# Patient Record
Sex: Female | Born: 1970 | Race: Black or African American | Hispanic: No | Marital: Married | Smoking: Never smoker
Health system: Southern US, Community
[De-identification: ages and names within clinical notes are randomized; demographics above are authoritative.]

## PROBLEM LIST (undated history)

## (undated) DIAGNOSIS — IMO0002 Reserved for concepts with insufficient information to code with codable children: Secondary | ICD-10-CM

## (undated) DIAGNOSIS — Z8619 Personal history of other infectious and parasitic diseases: Secondary | ICD-10-CM

## (undated) DIAGNOSIS — A749 Chlamydial infection, unspecified: Secondary | ICD-10-CM

## (undated) DIAGNOSIS — M1990 Unspecified osteoarthritis, unspecified site: Secondary | ICD-10-CM

## (undated) DIAGNOSIS — F419 Anxiety disorder, unspecified: Secondary | ICD-10-CM

## (undated) HISTORY — PX: WISDOM TOOTH EXTRACTION: SHX21

## (undated) HISTORY — DX: Chlamydial infection, unspecified: A74.9

## (undated) HISTORY — PX: TUBAL LIGATION: SHX77

## (undated) HISTORY — DX: Personal history of other infectious and parasitic diseases: Z86.19

## (undated) HISTORY — DX: Reserved for concepts with insufficient information to code with codable children: IMO0002

---
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
## 1994-02-01 DIAGNOSIS — R87619 Unspecified abnormal cytological findings in specimens from cervix uteri: Secondary | ICD-10-CM

## 1994-02-01 DIAGNOSIS — IMO0002 Reserved for concepts with insufficient information to code with codable children: Secondary | ICD-10-CM

## 1994-02-01 HISTORY — DX: Reserved for concepts with insufficient information to code with codable children: IMO0002

## 1994-02-01 HISTORY — DX: Unspecified abnormal cytological findings in specimens from cervix uteri: R87.619

## 1997-07-18 ENCOUNTER — Encounter: Admission: RE | Admit: 1997-07-18 | Discharge: 1997-07-18 | Payer: Self-pay | Admitting: Obstetrics

## 1997-11-07 ENCOUNTER — Other Ambulatory Visit: Admission: RE | Admit: 1997-11-07 | Discharge: 1997-11-07 | Payer: Self-pay | Admitting: *Deleted

## 1998-05-12 ENCOUNTER — Ambulatory Visit (HOSPITAL_COMMUNITY): Admission: RE | Admit: 1998-05-12 | Discharge: 1998-05-12 | Payer: Self-pay | Admitting: Obstetrics and Gynecology

## 1999-09-13 ENCOUNTER — Emergency Department (HOSPITAL_COMMUNITY): Admission: EM | Admit: 1999-09-13 | Discharge: 1999-09-13 | Payer: Self-pay | Admitting: Emergency Medicine

## 1999-09-15 ENCOUNTER — Other Ambulatory Visit: Admission: RE | Admit: 1999-09-15 | Discharge: 1999-09-15 | Payer: Self-pay | Admitting: Obstetrics and Gynecology

## 2000-03-13 ENCOUNTER — Emergency Department (HOSPITAL_COMMUNITY): Admission: EM | Admit: 2000-03-13 | Discharge: 2000-03-13 | Payer: Self-pay | Admitting: *Deleted

## 2000-03-22 ENCOUNTER — Encounter: Admission: RE | Admit: 2000-03-22 | Discharge: 2000-04-12 | Payer: Self-pay | Admitting: Family Medicine

## 2000-11-14 ENCOUNTER — Emergency Department (HOSPITAL_COMMUNITY): Admission: EM | Admit: 2000-11-14 | Discharge: 2000-11-14 | Payer: Self-pay | Admitting: Emergency Medicine

## 2001-12-20 ENCOUNTER — Encounter: Admission: RE | Admit: 2001-12-20 | Discharge: 2001-12-20 | Payer: Self-pay | Admitting: Internal Medicine

## 2003-05-05 ENCOUNTER — Emergency Department (HOSPITAL_COMMUNITY): Admission: EM | Admit: 2003-05-05 | Discharge: 2003-05-05 | Payer: Self-pay | Admitting: Emergency Medicine

## 2005-04-13 ENCOUNTER — Encounter: Admission: RE | Admit: 2005-04-13 | Discharge: 2005-04-13 | Payer: Self-pay | Admitting: Internal Medicine

## 2008-01-22 ENCOUNTER — Emergency Department (HOSPITAL_BASED_OUTPATIENT_CLINIC_OR_DEPARTMENT_OTHER): Admission: EM | Admit: 2008-01-22 | Discharge: 2008-01-22 | Payer: Self-pay | Admitting: Emergency Medicine

## 2009-03-01 ENCOUNTER — Ambulatory Visit: Payer: Self-pay | Admitting: Radiology

## 2009-03-01 ENCOUNTER — Emergency Department (HOSPITAL_BASED_OUTPATIENT_CLINIC_OR_DEPARTMENT_OTHER): Admission: EM | Admit: 2009-03-01 | Discharge: 2009-03-01 | Payer: Self-pay | Admitting: Emergency Medicine

## 2009-10-15 ENCOUNTER — Emergency Department (HOSPITAL_BASED_OUTPATIENT_CLINIC_OR_DEPARTMENT_OTHER): Admission: EM | Admit: 2009-10-15 | Discharge: 2009-10-15 | Payer: Self-pay | Admitting: Emergency Medicine

## 2010-08-23 ENCOUNTER — Encounter: Payer: Self-pay | Admitting: *Deleted

## 2010-08-23 ENCOUNTER — Emergency Department (HOSPITAL_BASED_OUTPATIENT_CLINIC_OR_DEPARTMENT_OTHER)
Admission: EM | Admit: 2010-08-23 | Discharge: 2010-08-23 | Disposition: A | Discharge status: Home / Self Care | Payer: BC Managed Care – PPO | Attending: Emergency Medicine | Admitting: Emergency Medicine

## 2010-08-23 DIAGNOSIS — IMO0002 Reserved for concepts with insufficient information to code with codable children: Secondary | ICD-10-CM | POA: Insufficient documentation

## 2010-08-23 DIAGNOSIS — M25519 Pain in unspecified shoulder: Secondary | ICD-10-CM | POA: Insufficient documentation

## 2010-08-23 DIAGNOSIS — X58XXXA Exposure to other specified factors, initial encounter: Secondary | ICD-10-CM | POA: Insufficient documentation

## 2010-08-23 MED ORDER — DIAZEPAM 5 MG PO TABS: 5.0000 mg | ORAL_TABLET | Freq: Two times a day (BID) | ORAL | Status: AC

## 2010-08-23 MED ORDER — IBUPROFEN 800 MG PO TABS: 800.0000 mg | ORAL_TABLET | Freq: Three times a day (TID) | ORAL | Status: AC

## 2010-08-23 NOTE — ED Notes (Signed)
Pt states she has had an achy feeling on the right side of her neck X 1 week. No known injury.

## 2010-08-23 NOTE — ED Provider Notes (Signed)
History     Chief Complaint  Patient presents with  . Neck Pain   HPI Pt has had intermittent pain in right anterior neck/shoulder x 1wk.  Aggravated by raising RUE above head.  Some relief w/ ibuprofen.  No associated upper extremity paresthesias.  Has not had cough, fever or SOB.  Denies trauma.    History reviewed. No pertinent past medical history.  Past Surgical History  Procedure Date  . Knee surgery   . Tubal ligation     History reviewed. No pertinent family history.  History  Substance Use Topics  . Smoking status: Never Smoker   . Smokeless tobacco: Not on file  . Alcohol Use: Yes    OB History    Grav Para Term Preterm Abortions TAB SAB Ect Mult Living                  Review of Systems  All other systems reviewed and are negative.    Physical Exam  BP 145/80  Pulse 72  Temp(Src) 99.5 F (37.5 C) (Oral)  Resp 20  Ht 5\' 7"  (1.702 m)  Wt 205 lb (92.987 kg)  BMI 32.11 kg/m2  SpO2 100%  LMP 08/09/2010  Physical Exam  Nursing note and vitals reviewed. Constitutional: She is oriented to person, place, and time. She appears well-developed and well-nourished. No distress.  HENT:  Head: Normocephalic and atraumatic.  Eyes:       Normal appearance  Neck: Normal range of motion. Neck supple.       Non-tender.  No carotid bruit  Cardiovascular: Normal rate and regular rhythm.   Pulmonary/Chest: Effort normal and breath sounds normal. No stridor.  Musculoskeletal:       No c-spine ttp. Tenderness of right trap b/w base of neck and humeral head.  No bony tenderness.  Pain aggravated by abduction >90 deg on exam.  NV of RUE intact.    Neurological: She is alert and oriented to person, place, and time.  Skin: Skin is warm and dry. No rash noted.  Psychiatric: She has a normal mood and affect. Her behavior is normal.    ED Course  Procedures  MDM Pt presents w/ non-traumatic right shoulder pain.  No bony tenderness on exam and RUE w/ full ROM and  intact NV.  S/Sx most consistent w/ muscle strain.  Pt discharged home w/ ibuprofen and valium for pain and I recommended rest/heat as well.  She has a PCP to f/u with.        Otilio Miu, [place] 08/24/10 [phone]

## 2011-08-26 ENCOUNTER — Other Ambulatory Visit: Payer: Self-pay | Admitting: Obstetrics and Gynecology

## 2011-08-26 ENCOUNTER — Encounter: Payer: Self-pay | Admitting: Obstetrics and Gynecology

## 2011-08-26 ENCOUNTER — Ambulatory Visit (INDEPENDENT_AMBULATORY_CARE_PROVIDER_SITE_OTHER): Payer: BC Managed Care – PPO | Admitting: Obstetrics and Gynecology

## 2011-08-26 VITALS — BP 144/82 | HR 84 | Ht 66.0 in | Wt 204.0 lb

## 2011-08-26 DIAGNOSIS — N852 Hypertrophy of uterus: Secondary | ICD-10-CM

## 2011-08-26 DIAGNOSIS — R350 Frequency of micturition: Secondary | ICD-10-CM

## 2011-08-26 DIAGNOSIS — Z01419 Encounter for gynecological examination (general) (routine) without abnormal findings: Secondary | ICD-10-CM

## 2011-08-26 DIAGNOSIS — Z124 Encounter for screening for malignant neoplasm of cervix: Secondary | ICD-10-CM

## 2011-08-26 NOTE — Progress Notes (Signed)
Regular Periods: yes Mammogram: yes  Monthly Breast Ex.: no Exercise: no  Tetanus < 10 years: yes Seatbelts: yes  NI. Bladder Functn.: yes Abuse at home: no  Daily BM's: no Stressful Work: no  Healthy Diet: no Sigmoid-Colonoscopy: NO  Calcium: no Medical problems this year: ? FIBROIDS   LAST PAP:2011  Contraception: BTL  Mammogram:  AGE 41  PCP: NO  PMH: NO CHANGE  FMH: NO CHANGE  Last Bone Scan: NO

## 2011-08-26 NOTE — Progress Notes (Signed)
Subjective:    Joan Riley is a 41 y.o. female, G2P2, who presents for an annual exam. The patient reports "abnormal growth in stomach"  noted about a month ago and went to the Urgent Care and was told that uterus was enlarged.  On occasion will ache but no true pain.  Has also noticed that it had become uncomfortable to wear a waistband.  Menstrual cycle:   LMP: Patient's last menstrual period was 08/05/2011. Flow 6 days, change pads every 3 hours; cramps 8/10 lessened to 5/10 with Midol           Review of Systems Pertinent items are noted in HPI. Denies pelvic pain but admits to an "ache"; denies vaginitis symptoms, irregular bleeding, menopausal symptoms, change in bowel habits or rectal bleeding   Objective:    BP 144/82  Pulse 84  Ht 5\' 6"  (1.676 m)  Wt 204 lb (92.534 kg)  BMI 32.93 kg/m2  LMP 08/05/2011    Wt Readings from Last 1 Encounters:  08/26/11 204 lb (92.534 kg)   Body mass index is 32.93 kg/(m^2). General Appearance: Alert, no acute distress HEENT: Grossly normal Neck / Thyroid: Supple, no thyromegaly or cervical adenopathy Lungs: Clear to auscultation bilaterally Back: No CVA tenderness Breast Exam: No masses or nodes.No dimpling, nipple retraction or discharge. Cardiovascular: Regular rate and rhythm.  Gastrointestinal: Soft, non-tender, no masses or organomegaly Pelvic Exam: EGBUS-wnl, vagina-normal rugae, small blood, cervix- without lesions or tenderness, uterus appears 20-22 weeks size with right fundus 24 weeks, tender and irregular, adnexae-no masses or tenderness Rectovaginal: no masses and normal sphincter tone Lymphatic Exam: Non-palpable nodes in neck, clavicular,  axillary, or inguinal regions  Skin: no rashes or abnormalities Extremities: no clubbing cyanosis or edema  Neurologic: grossly normal Psychiatric: Alert and oriented  Urinalysis: pH-5.0,  SG-1.020, blood 2+   Assessment:   Routine GYN Exam Urinary Frequency Uterine  Enlargement-Fibroids Plan:  Urine for culture   Pelvic U/S for uterine enlargement  PAP sent  Reviewed fibroids along with medical and surgical management options-patient want hysterectomy  Reviewed all types of hysterectomies with focus on abdominal hysterectomy given uterine size  RTO for U/S and follow up with physician of choice  Ezechiel Stooksbury,ELMIRAPA-C

## 2011-08-26 NOTE — Patient Instructions (Signed)
Fibroids You have been diagnosed as having a fibroid. Fibroids are smooth muscle lumps (tumors) which can occur any place in a woman's body. They are usually in the womb (uterus). The most common problem (symptom) of fibroids is bleeding. Over time this may cause low red blood cells (anemia). Other symptoms include feelings of pressure and pain in the pelvis. The diagnosis (learning what is wrong) of fibroids is made by physical exam. Sometimes tests such as an ultrasound are used. This is helpful when fibroids are felt around the ovaries and to look for tumors. TREATMENT   Most fibroids do not need surgical or medical treatment. Sometimes a tissue sample (biopsy) of the lining of the uterus is done to rule out cancer. If there is no cancer and only a small amount of bleeding, the problem can be watched.   Hormonal treatment can improve the problem.   When surgery is needed, it can consist of removing the fibroid. Vaginal birth may not be possible after the removal of fibroids. This depends on where they are and the extent of surgery. When pregnancy occurs with fibroids it is usually normal.   Your caregiver can help decide which treatments are best for you.  HOME CARE INSTRUCTIONS   Do not use aspirin as this may increase bleeding problems.   If your periods (menses) are heavy, record the number of pads or tampons used per month. Bring this information to your caregiver. This can help them determine the best treatment for you.  SEEK IMMEDIATE MEDICAL CARE IF:  You have pelvic pain or cramps not controlled with medications, or experience a sudden increase in pain.   You have an increase of pelvic bleeding between and during menses.   You feel lightheaded or have fainting spells.   You develop worsening belly (abdominal) pain.  Document Released: 01/16/2000 Document Revised: 01/07/2011 Document Reviewed: 09/07/2007 ExitCare Patient Information 2012 [employer].  Hysterectomy  Information  A hysterectomy is a procedure where your uterus is surgically removed. It will no longer be possible to have menstrual periods or to become pregnant. The tubes and ovaries can be removed (bilateral salpingo-oopherectomy) during this surgery as well.  REASONS FOR A HYSTERECTOMY  Persistent, abnormal bleeding.   Lasting (chronic) pelvic pain or infection.   The lining of the uterus (endometrium) starts growing outside the uterus (endometriosis).   The endometrium starts growing in the muscle of the uterus (adenomyosis).   The uterus falls down into the vagina (pelvic organ prolapse).   Symptomatic uterine fibroids.   Precancerous cells.   Cervical cancer or uterine cancer.  TYPES OF HYSTERECTOMIES  Supracervical hysterectomy. This type removes the top part of the uterus, but not the cervix.   Total hysterectomy. This type removes the uterus and cervix.   Radical hysterectomy. This type removes the uterus, cervix, and the fibrous tissue that holds the uterus in place in the pelvis (parametrium).  WAYS A HYSTERECTOMY CAN BE PERFORMED  Abdominal hysterectomy. A large surgical cut (incision) is made in the abdomen. The uterus is removed through this incision.   Vaginal hysterectomy. An incision is made in the vagina. The uterus is removed through this incision. There are no abdominal incisions.   Conventional laparoscopic hysterectomy. A thin, lighted tube with a camera (laparoscope) is inserted into 3 or 4 small incisions in the abdomen. The uterus is cut into small pieces. The small pieces are removed through the incisions, or they are removed through the vagina.   Laparoscopic assisted vaginal   hysterectomy (LAVH). Three or four small incisions are made in the abdomen. Part of the surgery is performed laparoscopically and part vaginally. The uterus is removed through the vagina.   Robot-assisted laparoscopic hysterectomy. A laparoscope is inserted into 3 or 4 small  incisions in the abdomen. A computer-controlled device is used to give the surgeon a 3D image. This allows for more precise movements of surgical instruments. The uterus is cut into small pieces and removed through the incisions or removed through the vagina.  RISKS OF HYSTERECTOMY   Bleeding and risk of blood transfusion. Tell your caregiver if you do not want to receive any blood products.   Blood clots in the legs or lung.   Infection.   Injury to surrounding organs.   Anesthesia problems or side effects.   Conversion to an abdominal hysterectomy.  WHAT TO EXPECT AFTER A HYSTERECTOMY  You will be given pain medicine.   You will need to have someone with you for the first 3 to 5 days after you go home.   You will need to follow up with your surgeon in 2 to 4 weeks after surgery to evaluate your progress.   You may have early menopause symptoms like hot flashes, night sweats, and insomnia.   If you had a hysterectomy for a problem that was not a cancer or a condition that could lead to cancer, then you no longer need Pap tests. However, even if you no longer need a Pap test, a regular exam is a good idea to make sure no other problems are starting.  Document Released: 07/14/2000 Document Revised: 01/07/2011 Document Reviewed: 08/29/2010 ExitCare Patient Information 2012 [employer]. 

## 2011-08-27 LAB — PAP IG W/ RFLX HPV ASCU

## 2011-09-01 ENCOUNTER — Telehealth: Payer: Self-pay | Admitting: Obstetrics and Gynecology

## 2011-09-02 NOTE — Telephone Encounter (Signed)
Pt rtnd call, informed pt urine culture showed no growth and pap results were negative, pt voices agreement.

## 2011-09-02 NOTE — Telephone Encounter (Signed)
Tc to pt regarding mst, lm on vm to call back.

## 2011-09-06 ENCOUNTER — Ambulatory Visit (INDEPENDENT_AMBULATORY_CARE_PROVIDER_SITE_OTHER): Payer: BC Managed Care – PPO

## 2011-09-06 ENCOUNTER — Ambulatory Visit (INDEPENDENT_AMBULATORY_CARE_PROVIDER_SITE_OTHER): Payer: BC Managed Care – PPO | Admitting: Obstetrics and Gynecology

## 2011-09-06 ENCOUNTER — Encounter: Payer: Self-pay | Admitting: Obstetrics and Gynecology

## 2011-09-06 VITALS — BP 140/62 | Ht 67.0 in | Wt 205.0 lb

## 2011-09-06 DIAGNOSIS — D259 Leiomyoma of uterus, unspecified: Secondary | ICD-10-CM

## 2011-09-06 DIAGNOSIS — N852 Hypertrophy of uterus: Secondary | ICD-10-CM

## 2011-09-06 DIAGNOSIS — D219 Benign neoplasm of connective and other soft tissue, unspecified: Secondary | ICD-10-CM

## 2011-09-06 NOTE — Patient Instructions (Signed)
ACOG: Hyserectomy   Fibroids

## 2011-09-06 NOTE — Progress Notes (Signed)
GYN PROBLEM VISIT  [name] is a 41 y.o. year old female,G2P1102, who presents for a problem visit. She was seen for the first time in the office by L. Caffie Damme who evaluated her for a suprapubic mass. She presents today for ultrasound evaluation.  Subjective: The patient notes that she has had right lower quadrant and umbilical discomfort for a number of months and she actually felt the mass in her lower abdomen. She denies GI symptomatology specifically she denies nausea vomiting constipation or diarrhea. She admits to urinary frequency but no dysuria. She has some dyspareunia. She has regular menses lasting for approximately 6 days but no intermenstrual bleeding and no significant cramping.  Objective:  BP 140/62  Ht 5\' 7"  (1.702 m)  Wt 205 lb (92.987 kg)  BMI 32.11 kg/m2  LMP 08/29/2011   Abdomen: The epigastric region is soft without organomegaly. There is a suprapubic mass that reaches to just above the umbilicus that is minimally tender from and nonmobile.  ULTRASOUND: Uterus: Length: 14.7 cm   Width:  10.4 cm   Height:  8.16 cm  Endo thickness:  0.622 cm   Left ovary:Normal Right ovary:Normal Fibroids:yes  Number:  3  Size(s): Large Fundal fibroid measures: 9.7cm x 8.6cm x 8.9cm Superior margin extends to umbilicus; Uterine measures above refect uterus measured with the fibroid and without for overall uterine volume.  Fibroid 2 intramural left: 2.9cm x 2.7cm x 3.4cm Fibroid 3: Right fundus: 2.2cm x 2cm x 1.9cm  CDS fluid:no   Assessment: Large symptomatic central suprapubic mass most consistent with uterine fibroid  Plan: Options for management of fibroids discussed and the patient is certain that she wishes to undergo hysterectomy. She understands that an abdominal hysterectomy is being recommended because of the size of her uterus I reviewed the indications as well as the risks of anesthesia bleeding infection damage to adjacent organs and   foreseeable risks of the procedure. She understands that she is likely to be in the hospital for 2 days and to be out of work for approximately 6 weeks. She has requested that we schedule the procedure for total abdominal hysterectomy. She understands that ovary and conservation will be the plan unless there is ovarian pathology or the ovaries cannot be salvaged.      Dierdre Forth,   09/06/2011 6:48 PM  Comment:

## 2011-09-08 ENCOUNTER — Telehealth: Payer: Self-pay | Admitting: Obstetrics and Gynecology

## 2011-09-08 NOTE — Telephone Encounter (Signed)
TAH scheduled for 10/19/2011 at 7:30am with [facility]/EP BC[employer] eff: 02/01/2010 pays 80/20 after $400. pre op due $327.47.acm

## 2011-09-17 ENCOUNTER — Encounter: Payer: BC Managed Care – PPO | Admitting: Obstetrics and Gynecology

## 2011-09-23 ENCOUNTER — Telehealth: Payer: Self-pay | Admitting: Obstetrics and Gynecology

## 2011-09-23 ENCOUNTER — Other Ambulatory Visit: Payer: Self-pay | Admitting: Obstetrics and Gynecology

## 2011-09-23 NOTE — Telephone Encounter (Signed)
TAH scheduled for 10/19/2011 at 7:30am with VH/EP [employer] eff: 02/01/2010 pays 80/20 after $400. pre op due $327.47.acm. Precert Required and obtained with Denny Levy at [facility] Ref #  [id number]. Joan Riley

## 2011-09-30 ENCOUNTER — Ambulatory Visit (INDEPENDENT_AMBULATORY_CARE_PROVIDER_SITE_OTHER): Payer: BC Managed Care – PPO | Admitting: Obstetrics and Gynecology

## 2011-09-30 ENCOUNTER — Encounter: Payer: Self-pay | Admitting: Obstetrics and Gynecology

## 2011-09-30 ENCOUNTER — Encounter (HOSPITAL_COMMUNITY): Payer: Self-pay

## 2011-09-30 VITALS — BP 140/70 | HR 70 | Temp 99.1°F | Resp 16 | Ht 66.0 in | Wt 206.0 lb

## 2011-09-30 DIAGNOSIS — Z01818 Encounter for other preprocedural examination: Secondary | ICD-10-CM

## 2011-09-30 DIAGNOSIS — D219 Benign neoplasm of connective and other soft tissue, unspecified: Secondary | ICD-10-CM

## 2011-09-30 DIAGNOSIS — D259 Leiomyoma of uterus, unspecified: Secondary | ICD-10-CM

## 2011-09-30 NOTE — Progress Notes (Signed)
Joan Riley is a 41 y.o. female [phone] who presents for a total abdominal hysterectomy because of large symptomatic uterine fibroids. During an evaluation at the Urgent Care in June for a noticeable "lump" in her abdomen,  the patient was told that she had fibroids.  Several months prior patient had noticed a  tightening of her waistband along with occasional right lower quadrant-umbilical "achiness"..  She goes on to report urinary frequency and  dyspareunia but denies nausea, vomiting, constipation, vaginitis symptoms or irregular bleeding.  Her menstrual flow is monthly for 6 days,  with a pad change every 3 hours.  Cramping is  minimal and easily relieved with Midol. A pelvic ultrasound done 09/06/11 showed: uterus: length-14.7 cm ,  width-10.4 cm, and  height- 8.16 cm with an endometrial  thickness of  0.622 cm;  both ovaries appeared normal;  # 3 measurable fibroids noted:  large fundal fibroid  9.7cm x 8.6cm x 8.9cm(superior margin extends to umbilicus), intramural left  fibroid 2.9cm x 2.7cm x 3.4cm and a right fundal fibroid  2.2cm x 2cm x 1.9cm .  There was no fluid in the cul-de-sac. After reviewing the medical and surgical management options for her fibroids, the patient has decided to proceed with hysterectomy.  Past Medical History  OB History: G2P1102 SVD,  1993 & 1994  GYN History: menarche 41 YO;    LMP: 08/29/11;    Contracepton: Tubal Sterilization; Has a remote history of   chlamydia. and abnormal PAP smear  that was treated with cryotherapy.  PAP snmears have been normal since that time.  Last PAP smear July 2013  Medical History: eczema  Surgical History:  1990  Right Knee Reconstruction and 2002 Tubal Sterilization  Denies problems with anesthesia or history of blood transfusions  Family History: heart disease, cancer, hypertension,  thyroid disease, diabetes  Social History:  Married and employed at [employer] Denies tobacco or illicit drug use but admits to  occasional wine.   Outpatient Encounter Prescriptions as of 09/30/2011  Medication Sig Dispense Refill  . ibuprofen (ADVIL,MOTRIN) 200 MG tablet Take 400 mg by mouth as needed. pain         Allergies  Allergen Reactions  . Sulfa Antibiotics Hives and Itching   Denies sensitivity to peanuts, soy, shellfish, latex, or adhesives   ROS: Admits to occasional right knee swelling but,  Denies headache, vision changes, dysphagia, tinnitus, dizziness,  chest pain, shortness of breath, nausea, vomiting, diarrhea, dysuria, hematuria, swelling of joints,easy bruising,  myalgias, arthralgias, skin rashes and except as is mentioned in the history of present illness, patient's review of systems is otherwise negative.  Physical Exam    BP 140/70  Pulse 70  Resp 16  Ht 5\' 6"  (1.676 m)  Wt 206 lb (93.441 kg)  BMI 33.25 kg/m2  LMP 09/23/2011  Neck: supple without masses or thyromegaly Lungs: clear to auscultation Heart: regular rate and rhythm Abdomen: soft, with  a firm mass arising from the pelvis to level of umbilicus, slight tenderness  without guarding Pelvic:EGBUS- wnl; vagina-normal rugae; uterus-20-22 weeks size with right fundus extending to 24 weeks size,  cervix without lesions or motion tenderness; adnexae-no tenderness or masses Extremities:  no clubbing, cyanosis or edema  Endometrial biopsy: fragments of proliferative endometrium with no hyperplasia, atypia or malignancy identified.  Assesment: Large Symptomatic Uterine Fibroids   Disposition:  A discussion was held with patient regarding the indication for her procedure(s) along with the risks, which include but are not  limited to: reaction to anesthesia, damage to adjacent organs, infection and excessive bleeding. Patient verbalized understanding of these risks and has consented to proceed with a Total Abdominal Hysterectomy at [facility] [place], on October 19, 2011 at 7:30 a.m.   CSN# [id number]   Cillian Gwinner J.  Lowell Guitar, PA-C  for Dr. Maris Berger. Haygood

## 2011-10-06 NOTE — H&P (Signed)
Joan Riley is a 41 y.o. female G2P1102 who presents for a total abdominal hysterectomy because of large symptomatic uterine fibroids. During an evaluation at the Urgent Care in June for a noticeable "lump" in her abdomen,  the patient was told that she had fibroids.  Several months prior patient had noticed a  tightening of her waistband along with occasional right lower quadrant-umbilical "achiness"..  She goes on to report urinary frequency and  dyspareunia but denies nausea, vomiting, constipation, vaginitis symptoms or irregular bleeding.  Her menstrual flow is monthly for 6 days,  with a pad change every 3 hours.  Cramping is  minimal and easily relieved with Midol. A pelvic ultrasound done 09/06/11 showed: uterus: length-14.7 cm ,  width-10.4 cm, and  height- 8.16 cm with an endometrial  thickness of  0.622 cm;  both ovaries appeared normal;  # 3 measurable fibroids noted:  large fundal fibroid  9.7cm x 8.6cm x 8.9cm(superior margin extends to umbilicus), intramural left  fibroid 2.9cm x 2.7cm x 3.4cm and a right fundal fibroid  2.2cm x 2cm x 1.9cm .  There was no fluid in the cul-de-sac. After reviewing the medical and surgical management options for her fibroids, the patient has decided to proceed with hysterectomy.  Past Medical History  OB History: G2P1102 SVD,  1993 & 1994  GYN History: menarche 41 YO;    LMP: 08/29/11;    Contracepton: Tubal Sterilization; Has a remote history of   chlamydia. and abnormal PAP smear  that was treated with cryotherapy.  PAP snmears have been normal since that time.  Last PAP smear July 2013  Medical History: eczema  Surgical History:  1990  Right Knee Reconstruction and 2002 Tubal Sterilization  Denies problems with anesthesia or history of blood transfusions  Family History: heart disease, cancer, hypertension,  thyroid disease, diabetes  Social History:  Married and employed at [employer] Denies tobacco or illicit drug use but admits to  occasional wine.   Outpatient Encounter Prescriptions as of 09/30/2011  Medication Sig Dispense Refill  . ibuprofen (ADVIL,MOTRIN) 200 MG tablet Take 400 mg by mouth as needed. pain         Allergies  Allergen Reactions  . Sulfa Antibiotics Hives and Itching   Denies sensitivity to peanuts, soy, shellfish, latex, or adhesives   ROS: Admits to occasional right knee swelling but,  Denies headache, vision changes, dysphagia, tinnitus, dizziness,  chest pain, shortness of breath, nausea, vomiting, diarrhea, dysuria, hematuria, swelling of joints,easy bruising,  myalgias, arthralgias, skin rashes and except as is mentioned in the history of present illness, patient's review of systems is otherwise negative.  Physical Exam    BP 140/70  Pulse 70  Resp 16  Ht 5' 6" (1.676 m)  Wt 206 lb (93.441 kg)  BMI 33.25 kg/m2  LMP 09/23/2011  Neck: supple without masses or thyromegaly Lungs: clear to auscultation Heart: regular rate and rhythm Abdomen: soft, with  a firm mass arising from the pelvis to level of umbilicus, slight tenderness  without guarding Pelvic:EGBUS- wnl; vagina-normal rugae; uterus-20-22 weeks size with right fundus extending to 24 weeks size,  cervix without lesions or motion tenderness; adnexae-no tenderness or masses Extremities:  no clubbing, cyanosis or edema  Endometrial biopsy: fragments of proliferative endometrium with no hyperplasia, atypia or malignancy identified.  Assesment: Large Symptomatic Uterine Fibroids   Disposition:  A discussion was held with patient regarding the indication for her procedure(s) along with the risks, which include but are not   limited to: reaction to anesthesia, damage to adjacent organs, infection and excessive bleeding. Patient verbalized understanding of these risks and has consented to proceed with a Total Abdominal Hysterectomy at [facility], on October 19, 2011 at 7:30 a.m.   CSN# [id number]   Bianca Vester J.  Hibba Schram, PA-C  for Dr. Vanessa P. Haygood   

## 2011-10-15 ENCOUNTER — Encounter (HOSPITAL_COMMUNITY)
Admission: RE | Admit: 2011-10-15 | Discharge: 2011-10-15 | Disposition: A | Discharge status: Home / Self Care | Payer: BC Managed Care – PPO | Source: Ambulatory Visit | Attending: Obstetrics and Gynecology | Admitting: Obstetrics and Gynecology

## 2011-10-15 ENCOUNTER — Encounter (HOSPITAL_COMMUNITY): Payer: Self-pay

## 2011-10-15 HISTORY — DX: Unspecified osteoarthritis, unspecified site: M19.90

## 2011-10-15 HISTORY — DX: Anxiety disorder, unspecified: F41.9

## 2011-10-15 LAB — CBC
Hemoglobin: 12.7 g/dL (ref 12.0–15.0)
MCH: 25.8 pg — ABNORMAL LOW (ref 26.0–34.0)
MCHC: 31.5 g/dL (ref 30.0–36.0)
MCV: 81.7 fL (ref 78.0–100.0)
RBC: 4.93 MIL/uL (ref 3.87–5.11)
WBC: 5.9 10*3/uL (ref 4.0–10.5)

## 2011-10-15 LAB — SURGICAL PCR SCREEN: Staphylococcus aureus: NEGATIVE

## 2011-10-15 NOTE — Patient Instructions (Addendum)
   Your procedure is scheduled on: Tuesday September 17th  Enter through the Main Entrance of [facility] at:6am Pick up the phone at the desk and dial [phone] and inform us of your arrival.  Please call this number if you have any problems the morning of surgery: [phone]  Remember: Do not eat or drink anything after midnight on Monday night  Do not wear jewelry, make-up, or FINGER nail polish No metal in your hair or on your body. Do not wear lotions, powders, perfumes or deodorant. Do not shave 48 hours prior to surgery. Do not bring valuables to the hospital.  Leave suitcase in the car. After Surgery it may be brought to your room. For patients being admitted to the hospital, checkout time is 11:00am the day of discharge.  \ Remember to use your hibiclens as instructed.Please shower with 1/2 bottle the evening before your surgery and the other 1/2 bottle the morning of surgery. Neck down avoiding private area.

## 2011-10-18 ENCOUNTER — Telehealth: Payer: Self-pay | Admitting: Obstetrics and Gynecology

## 2011-10-18 NOTE — Telephone Encounter (Signed)
Telephone call to patient to answer any questions about tomorrow's surgery.  She asked that a picture of her fibroids be taken, and confimed that she will require a midline abdominal incision.  She had no other questions.

## 2011-10-19 ENCOUNTER — Encounter (HOSPITAL_COMMUNITY): Payer: Self-pay | Admitting: Obstetrics and Gynecology

## 2011-10-19 ENCOUNTER — Encounter (HOSPITAL_COMMUNITY)
Admission: RE | Disposition: A | Discharge status: Home / Self Care | Payer: Self-pay | Source: Ambulatory Visit | Attending: Obstetrics and Gynecology

## 2011-10-19 ENCOUNTER — Inpatient Hospital Stay (HOSPITAL_COMMUNITY)
Admission: RE | Admit: 2011-10-19 | Discharge: 2011-10-21 | DRG: 359 | Disposition: A | Discharge status: Home / Self Care | Payer: BC Managed Care – PPO | Source: Ambulatory Visit | Attending: Obstetrics and Gynecology | Admitting: Obstetrics and Gynecology

## 2011-10-19 ENCOUNTER — Encounter (HOSPITAL_COMMUNITY): Payer: Self-pay | Admitting: Anesthesiology

## 2011-10-19 ENCOUNTER — Inpatient Hospital Stay (HOSPITAL_COMMUNITY): Payer: BC Managed Care – PPO | Admitting: Anesthesiology

## 2011-10-19 DIAGNOSIS — D251 Intramural leiomyoma of uterus: Secondary | ICD-10-CM | POA: Diagnosis present

## 2011-10-19 DIAGNOSIS — D252 Subserosal leiomyoma of uterus: Secondary | ICD-10-CM | POA: Diagnosis present

## 2011-10-19 DIAGNOSIS — D259 Leiomyoma of uterus, unspecified: Secondary | ICD-10-CM

## 2011-10-19 DIAGNOSIS — D250 Submucous leiomyoma of uterus: Principal | ICD-10-CM | POA: Diagnosis present

## 2011-10-19 HISTORY — PX: ABDOMINAL HYSTERECTOMY: SHX81

## 2011-10-19 LAB — PREGNANCY, URINE: Preg Test, Ur: NEGATIVE

## 2011-10-19 SURGERY — HYSTERECTOMY, ABDOMINAL
Anesthesia: General | Site: Abdomen | Wound class: Clean Contaminated

## 2011-10-19 MED ORDER — MIDAZOLAM HCL 5 MG/5ML IJ SOLN
INTRAMUSCULAR | Status: DC | PRN
  Administered 2011-10-19: 2 mg via INTRAVENOUS

## 2011-10-19 MED ORDER — FENTANYL CITRATE 0.05 MG/ML IJ SOLN
INTRAMUSCULAR | Status: AC
  Administered 2011-10-19: 50 ug via INTRAVENOUS
  Filled 2011-10-19: qty 2

## 2011-10-19 MED ORDER — BUPIVACAINE HCL (PF) 0.25 % IJ SOLN
INTRAMUSCULAR | Status: DC | PRN
  Administered 2011-10-19: 30 mL

## 2011-10-19 MED ORDER — MIDAZOLAM HCL 2 MG/2ML IJ SOLN: 0.5000 mg | Freq: Once | INTRAMUSCULAR | Status: DC | PRN

## 2011-10-19 MED ORDER — OXYCODONE-ACETAMINOPHEN 5-325 MG PO TABS: 1.0000 | ORAL_TABLET | ORAL | Status: DC | PRN

## 2011-10-19 MED ORDER — ONDANSETRON HCL 4 MG PO TABS: 4.0000 mg | ORAL_TABLET | Freq: Three times a day (TID) | ORAL | Status: DC | PRN

## 2011-10-19 MED ORDER — LIDOCAINE HCL (CARDIAC) 20 MG/ML IV SOLN
INTRAVENOUS | Status: AC
  Filled 2011-10-19: qty 5

## 2011-10-19 MED ORDER — CEFAZOLIN SODIUM-DEXTROSE 2-3 GM-% IV SOLR
2.0000 g | INTRAVENOUS | Status: AC
  Administered 2011-10-19: 2 g via INTRAVENOUS

## 2011-10-19 MED ORDER — MENTHOL 3 MG MT LOZG: 1.0000 | LOZENGE | OROMUCOSAL | Status: DC | PRN

## 2011-10-19 MED ORDER — DIPHENHYDRAMINE HCL 12.5 MG/5ML PO ELIX
12.5000 mg | ORAL_SOLUTION | Freq: Four times a day (QID) | ORAL | Status: DC | PRN
  Filled 2011-10-19: qty 5

## 2011-10-19 MED ORDER — SODIUM CHLORIDE 0.9 % IJ SOLN: 9.0000 mL | INTRAMUSCULAR | Status: DC | PRN

## 2011-10-19 MED ORDER — HYDROMORPHONE HCL PF 1 MG/ML IJ SOLN
INTRAMUSCULAR | Status: AC
  Filled 2011-10-19: qty 1

## 2011-10-19 MED ORDER — ROCURONIUM BROMIDE 50 MG/5ML IV SOLN
INTRAVENOUS | Status: AC
  Filled 2011-10-19: qty 1

## 2011-10-19 MED ORDER — NALOXONE HCL 0.4 MG/ML IJ SOLN: 0.4000 mg | INTRAMUSCULAR | Status: DC | PRN

## 2011-10-19 MED ORDER — DEXAMETHASONE SODIUM PHOSPHATE 4 MG/ML IJ SOLN
INTRAMUSCULAR | Status: DC | PRN
  Administered 2011-10-19: 10 mg via INTRAVENOUS

## 2011-10-19 MED ORDER — GLYCOPYRROLATE 0.2 MG/ML IJ SOLN
INTRAMUSCULAR | Status: AC
  Filled 2011-10-19: qty 1

## 2011-10-19 MED ORDER — ONDANSETRON HCL 4 MG/2ML IJ SOLN
INTRAMUSCULAR | Status: AC
  Filled 2011-10-19: qty 2

## 2011-10-19 MED ORDER — NEOSTIGMINE METHYLSULFATE 1 MG/ML IJ SOLN
INTRAMUSCULAR | Status: DC | PRN
  Administered 2011-10-19: 3 mg via INTRAVENOUS

## 2011-10-19 MED ORDER — HYDROMORPHONE HCL PF 1 MG/ML IJ SOLN
INTRAMUSCULAR | Status: DC | PRN
  Administered 2011-10-19 (×2): 1 mg via INTRAVENOUS

## 2011-10-19 MED ORDER — FENTANYL CITRATE 0.05 MG/ML IJ SOLN
INTRAMUSCULAR | Status: AC
  Filled 2011-10-19: qty 2

## 2011-10-19 MED ORDER — MENTHOL 3 MG MT LOZG
1.0000 | LOZENGE | OROMUCOSAL | Status: DC | PRN
  Filled 2011-10-19: qty 9

## 2011-10-19 MED ORDER — SIMETHICONE 80 MG PO CHEW: 80.0000 mg | CHEWABLE_TABLET | Freq: Four times a day (QID) | ORAL | Status: DC | PRN

## 2011-10-19 MED ORDER — MEPERIDINE HCL 25 MG/ML IJ SOLN: 6.2500 mg | INTRAMUSCULAR | Status: DC | PRN

## 2011-10-19 MED ORDER — CEFAZOLIN SODIUM-DEXTROSE 2-3 GM-% IV SOLR
INTRAVENOUS | Status: AC
  Filled 2011-10-19: qty 50

## 2011-10-19 MED ORDER — LACTATED RINGERS IV SOLN
INTRAVENOUS | Status: DC
  Administered 2011-10-19: 125 mL/h via INTRAVENOUS

## 2011-10-19 MED ORDER — ONDANSETRON HCL 4 MG/2ML IJ SOLN
4.0000 mg | Freq: Four times a day (QID) | INTRAMUSCULAR | Status: DC | PRN
  Filled 2011-10-19: qty 2

## 2011-10-19 MED ORDER — PROPOFOL 10 MG/ML IV EMUL
INTRAVENOUS | Status: AC
  Filled 2011-10-19: qty 20

## 2011-10-19 MED ORDER — HYDROMORPHONE 0.3 MG/ML IV SOLN
INTRAVENOUS | Status: DC
  Administered 2011-10-19: 1.4 mg via INTRAVENOUS
  Administered 2011-10-19: 13:00:00 via INTRAVENOUS
  Administered 2011-10-20: 0.3 mg via INTRAVENOUS
  Filled 2011-10-19: qty 25

## 2011-10-19 MED ORDER — 0.9 % SODIUM CHLORIDE (POUR BTL) OPTIME
TOPICAL | Status: DC | PRN
  Administered 2011-10-19 (×2): 1000 mL

## 2011-10-19 MED ORDER — GLYCOPYRROLATE 0.2 MG/ML IJ SOLN
INTRAMUSCULAR | Status: DC | PRN
  Administered 2011-10-19: 0.4 mg via INTRAVENOUS
  Administered 2011-10-19: 0.2 mg via INTRAVENOUS

## 2011-10-19 MED ORDER — DEXAMETHASONE SODIUM PHOSPHATE 10 MG/ML IJ SOLN
INTRAMUSCULAR | Status: AC
  Filled 2011-10-19: qty 1

## 2011-10-19 MED ORDER — ROCURONIUM BROMIDE 100 MG/10ML IV SOLN
INTRAVENOUS | Status: DC | PRN
  Administered 2011-10-19: 10 mg via INTRAVENOUS
  Administered 2011-10-19: 20 mg via INTRAVENOUS
  Administered 2011-10-19: 10 mg via INTRAVENOUS
  Administered 2011-10-19: 50 mg via INTRAVENOUS

## 2011-10-19 MED ORDER — HYDROMORPHONE 0.3 MG/ML IV SOLN: INTRAVENOUS | Status: DC

## 2011-10-19 MED ORDER — IBUPROFEN 600 MG PO TABS
600.0000 mg | ORAL_TABLET | Freq: Four times a day (QID) | ORAL | Status: DC | PRN
  Administered 2011-10-20 – 2011-10-21 (×3): 600 mg via ORAL
  Filled 2011-10-19 (×3): qty 1

## 2011-10-19 MED ORDER — FENTANYL CITRATE 0.05 MG/ML IJ SOLN
25.0000 ug | INTRAMUSCULAR | Status: DC | PRN
  Administered 2011-10-19 (×3): 50 ug via INTRAVENOUS

## 2011-10-19 MED ORDER — FENTANYL CITRATE 0.05 MG/ML IJ SOLN
INTRAMUSCULAR | Status: AC
  Filled 2011-10-19: qty 5

## 2011-10-19 MED ORDER — KETOROLAC TROMETHAMINE 30 MG/ML IJ SOLN: 30.0000 mg | Freq: Four times a day (QID) | INTRAMUSCULAR | Status: DC

## 2011-10-19 MED ORDER — DIPHENHYDRAMINE HCL 12.5 MG/5ML PO ELIX: 12.5000 mg | ORAL_SOLUTION | Freq: Four times a day (QID) | ORAL | Status: DC | PRN

## 2011-10-19 MED ORDER — KETOROLAC TROMETHAMINE 30 MG/ML IJ SOLN: 15.0000 mg | Freq: Once | INTRAMUSCULAR | Status: DC | PRN

## 2011-10-19 MED ORDER — FENTANYL CITRATE 0.05 MG/ML IJ SOLN
INTRAMUSCULAR | Status: DC | PRN
  Administered 2011-10-19: 50 ug via INTRAVENOUS
  Administered 2011-10-19: 100 ug via INTRAVENOUS
  Administered 2011-10-19 (×2): 50 ug via INTRAVENOUS
  Administered 2011-10-19: 100 ug via INTRAVENOUS

## 2011-10-19 MED ORDER — OXYCODONE-ACETAMINOPHEN 5-325 MG PO TABS
1.0000 | ORAL_TABLET | ORAL | Status: DC | PRN
  Administered 2011-10-20 – 2011-10-21 (×2): 1 via ORAL
  Filled 2011-10-19 (×2): qty 1

## 2011-10-19 MED ORDER — PROPOFOL 10 MG/ML IV EMUL
INTRAVENOUS | Status: DC | PRN
  Administered 2011-10-19: 200 mg via INTRAVENOUS
  Administered 2011-10-19 (×2): 50 mg via INTRAVENOUS

## 2011-10-19 MED ORDER — ONDANSETRON HCL 4 MG PO TABS: 4.0000 mg | ORAL_TABLET | Freq: Four times a day (QID) | ORAL | Status: DC | PRN

## 2011-10-19 MED ORDER — DIPHENHYDRAMINE HCL 50 MG/ML IJ SOLN: 12.5000 mg | Freq: Four times a day (QID) | INTRAMUSCULAR | Status: DC | PRN

## 2011-10-19 MED ORDER — MIDAZOLAM HCL 2 MG/2ML IJ SOLN
INTRAMUSCULAR | Status: AC
  Filled 2011-10-19: qty 2

## 2011-10-19 MED ORDER — ONDANSETRON HCL 4 MG/2ML IJ SOLN
4.0000 mg | Freq: Four times a day (QID) | INTRAMUSCULAR | Status: DC | PRN
  Administered 2011-10-19: 4 mg via INTRAVENOUS

## 2011-10-19 MED ORDER — BUPIVACAINE HCL (PF) 0.25 % IJ SOLN
INTRAMUSCULAR | Status: AC
  Filled 2011-10-19: qty 30

## 2011-10-19 MED ORDER — PROMETHAZINE HCL 25 MG/ML IJ SOLN: 6.2500 mg | INTRAMUSCULAR | Status: DC | PRN

## 2011-10-19 MED ORDER — HEPARIN SODIUM (PORCINE) 5000 UNIT/ML IJ SOLN
INTRAMUSCULAR | Status: DC | PRN
  Administered 2011-10-19: 5000 [IU]

## 2011-10-19 MED ORDER — LACTATED RINGERS IV SOLN
INTRAVENOUS | Status: DC
  Administered 2011-10-19 (×3): via INTRAVENOUS

## 2011-10-19 MED ORDER — IBUPROFEN 600 MG PO TABS: 600.0000 mg | ORAL_TABLET | Freq: Four times a day (QID) | ORAL | Status: DC | PRN

## 2011-10-19 MED ORDER — ONDANSETRON HCL 4 MG/2ML IJ SOLN
INTRAMUSCULAR | Status: DC | PRN
  Administered 2011-10-19: 4 mg via INTRAVENOUS

## 2011-10-19 MED ORDER — KETOROLAC TROMETHAMINE 30 MG/ML IJ SOLN
30.0000 mg | Freq: Four times a day (QID) | INTRAMUSCULAR | Status: DC
  Administered 2011-10-19 – 2011-10-20 (×3): 30 mg via INTRAVENOUS
  Filled 2011-10-19 (×3): qty 1

## 2011-10-19 MED ORDER — LACTATED RINGERS IV SOLN
INTRAVENOUS | Status: DC
  Administered 2011-10-19 – 2011-10-20 (×3): via INTRAVENOUS

## 2011-10-19 MED ORDER — ONDANSETRON HCL 4 MG/2ML IJ SOLN: 4.0000 mg | Freq: Four times a day (QID) | INTRAMUSCULAR | Status: DC | PRN

## 2011-10-19 MED ORDER — LIDOCAINE HCL (CARDIAC) 20 MG/ML IV SOLN
INTRAVENOUS | Status: DC | PRN
  Administered 2011-10-19: 20 mg via INTRAVENOUS

## 2011-10-19 SURGICAL SUPPLY — 50 items
BLADE HEX COATED 2.75 (ELECTRODE) IMPLANT
CANISTER SUCTION 2500CC (MISCELLANEOUS) ×2 IMPLANT
CATH ROBINSON RED A/P 16FR (CATHETERS) IMPLANT
CHLORAPREP W/TINT 26ML (MISCELLANEOUS) ×2 IMPLANT
CLOTH BEACON ORANGE TIMEOUT ST (SAFETY) ×2 IMPLANT
CONT PATH 16OZ SNAP LID 3702 (MISCELLANEOUS) ×2 IMPLANT
CONT SPECI 4OZ STER CLIK (MISCELLANEOUS) IMPLANT
DECANTER SPIKE VIAL GLASS SM (MISCELLANEOUS) IMPLANT
DERMABOND ADVANCED (GAUZE/BANDAGES/DRESSINGS) ×1
DERMABOND ADVANCED .7 DNX12 (GAUZE/BANDAGES/DRESSINGS) ×1 IMPLANT
DRAIN JACKSON PRT FLT 7MM (DRAIN) IMPLANT
DRAPE LAPAROTOMY T 102X78X121 (DRAPES) ×2 IMPLANT
ELECT NEEDLE TIP 2.8 STRL (NEEDLE) IMPLANT
EVACUATOR SILICONE 100CC (DRAIN) IMPLANT
GAUZE SPONGE 4X4 16PLY XRAY LF (GAUZE/BANDAGES/DRESSINGS) ×2 IMPLANT
GLOVE BIOGEL PI IND STRL 7.0 (GLOVE) ×2 IMPLANT
GLOVE BIOGEL PI INDICATOR 7.0 (GLOVE) ×2
GLOVE INDICATOR 6.5 STRL GRN (GLOVE) ×6 IMPLANT
GLOVE INDICATOR 7.0 STRL GRN (GLOVE) ×6 IMPLANT
GLOVE SURG SS PI 6.5 STRL IVOR (GLOVE) ×8 IMPLANT
GOWN BRE IMP SLV AUR XL STRL (GOWN DISPOSABLE) ×4 IMPLANT
GOWN PREVENTION PLUS LG XLONG (DISPOSABLE) ×6 IMPLANT
NEEDLE HYPO 25X1 1.5 SAFETY (NEEDLE) IMPLANT
NEEDLE SPNL 22GX3.5 QUINCKE BK (NEEDLE) ×2 IMPLANT
NS IRRIG 1000ML POUR BTL (IV SOLUTION) ×4 IMPLANT
PACK ABDOMINAL GYN (CUSTOM PROCEDURE TRAY) ×2 IMPLANT
PAD OB MATERNITY 4.3X12.25 (PERSONAL CARE ITEMS) ×2 IMPLANT
PROTECTOR NERVE ULNAR (MISCELLANEOUS) ×2 IMPLANT
SPONGE LAP 18X18 X RAY DECT (DISPOSABLE) ×4 IMPLANT
STAPLER VISISTAT 35W (STAPLE) IMPLANT
SUT CHROMIC 2 0 SH (SUTURE) ×4 IMPLANT
SUT MNCRL AB 3-0 PS2 27 (SUTURE) IMPLANT
SUT PDS AB 1 CT  36 (SUTURE)
SUT PDS AB 1 CT 36 (SUTURE) IMPLANT
SUT PLAIN 2 0 XLH (SUTURE) IMPLANT
SUT SILK 0 FSL (SUTURE) IMPLANT
SUT VIC AB 0 CT1 18XCR BRD8 (SUTURE) ×3 IMPLANT
SUT VIC AB 0 CT1 27 (SUTURE) ×2
SUT VIC AB 0 CT1 27XBRD ANBCTR (SUTURE) ×2 IMPLANT
SUT VIC AB 0 CT1 8-18 (SUTURE) ×3
SUT VIC AB 2-0 CT1 (SUTURE) ×2 IMPLANT
SUT VIC AB 3-0 SH 27 (SUTURE)
SUT VIC AB 3-0 SH 27X BRD (SUTURE) IMPLANT
SUT VICRYL 0 TIES 12 18 (SUTURE) ×2 IMPLANT
SYR 50ML LL SCALE MARK (SYRINGE) IMPLANT
SYR CONTROL 10ML LL (SYRINGE) IMPLANT
SYR TB 1ML 25GX5/8 (SYRINGE) IMPLANT
TOWEL OR 17X24 6PK STRL BLUE (TOWEL DISPOSABLE) ×4 IMPLANT
TRAY FOLEY CATH 14FR (SET/KITS/TRAYS/PACK) ×2 IMPLANT
WATER STERILE IRR 1000ML POUR (IV SOLUTION) ×2 IMPLANT

## 2011-10-19 NOTE — Addendum Note (Signed)
Addendum  created 10/19/11 1427 by Renford Dills, CRNA   Modules edited:Notes Section

## 2011-10-19 NOTE — H&P (Signed)
  History and Physical Interval Note:   10/19/2011   7:29 AM   Joan Riley  has presented today for surgery, with the diagnosis of symptomatic fibroids  The various methods of treatment have been discussed with the patient and family. After consideration of risks, benefits and other options for treatment, the patient has consented to  Procedure(s): HYSTERECTOMY ABDOMINAL as a surgical intervention .  I have reviewed the patients' chart and labs, and examined the patient.  Questions were answered to the patient's satisfaction.     Hal Morales  MD

## 2011-10-19 NOTE — Progress Notes (Signed)
Joan Riley is a65 y.o.  [id number]  Post Op Date # 0:  TAH/BS for symptomatic uterine fibroids  Subjective: Patient is Doing well postoperatively. Patient has Pain is controlled with current analgesics. Medications being used: Toradol and PCA., single episode of transient nausea upon arrival on third floor. but none since. Diet:Tolerating clear liquids Foley catheter in place and draining; no flatus Activity: Other: Ambulated in the room without difficulty.  Objective: Vital signs in last 24 hours: Temp:  [98 F (36.7 C)-98.8 F (37.1 C)] 98.4 F (36.9 C) (09/17 1730) Pulse Rate:  [76-107] 97  (09/17 1730) Resp:  [6-21] 16  (09/17 1820) BP: (144-161)/(69-92) 144/92 mmHg (09/17 1730) SpO2:  [94 %-100 %] 100 % (09/17 1820)  Intake/Output from previous day:   Intake/Output this shift: Total I/O In: 3120 [P.O.:120; I.V.:3000] Out: 775 [Urine:600; Blood:175]  Lab 10/15/11 1336  WBC 5.9  HGB 12.7  HCT 40.3  PLT 252    No results found for this basename: NA:3,K:3,CL:3,CO2:3,BUN:3,CREATININE:3,CALCIUM:3,LABALBU:3,PROT:3,BILITOT:3,ALKPHOS:3,ALT:3,AST:3,GLUCOSE:3 in the last 168 hours  EXAM: General: alert, cooperative and in no acute distress Heart: regular rate and rhythm without murmur Lungs: clear to auscultation bilaterally; no wheezes, rales or rhonchi Abdomen: soft and appropriately tender; decreased bowel sounds Extremities: SCD hose in place and functioning; no calf tenderness and negative Homan's sign Incision: intact without evidence of infection; perineal pad is clean  Assessment: s/p Procedure(s): HYSTERECTOMY ABDOMINAL with Bilateral Salpingectomy: stable and progressing well  Plan: Routine care  LOS: 0 days    Haden Cavenaugh, PA-C 10/19/2011 6:43 PM

## 2011-10-19 NOTE — Op Note (Signed)
10/19/2011  10:53 AM  PATIENT:  Joan Riley  41 y.o. female MRN:  [id number]  PRE-OPERATIVE DIAGNOSIS:  symptomatic fibroids  POST-OPERATIVE DIAGNOSIS:  uterine fibroids  PROCEDURE:  Procedure(s): HYSTERECTOMY ABDOMINAL BILATERAL SALPINGECTOMY SURGEON:  Surgeon(s): Hal Morales, MD  ASSISTANTS: Kerry Fort POWELL, PA-C  ANESTHESIA:  General  ESTIMATED BLOOD LOSS: 175CC  COMPLICATIONS:NONE  BLOOD ADMINISTERED:none  DRAINS: none   LOCAL MEDICATIONS USED:  MARCAINE     SPECIMEN:  Source of Specimen:  Uterus, tubes and cervix  DISPOSITION OF SPECIMEN:  PATHOLOGY  COUNTS:  YES  FINDINGS:  The uterus was enlarged to 18 weeks size, just above the umbilicus on the abdomen.  At laparotomy, there was a large fundal central myoma.  The tubes were status post tubal sterilization.  The ovaries were normal bilaterally.  There was a bluish lesion on the right round ligament near its connection to the uterus, consistent with endometriosis.  No other stigmata of endometriosis were noted. The uterus weighed 828 gms.  PROCEDURE: The patient was taken to the operating room after appropriate identification and placed on the operating table in the supine position. Equipment for the induction of general anesthesia was placed and after the attainment of adequate general anesthesia the abdomen was prepped with choloraprep. The  perineum, and vagina were prepped with multiple layers of Betadine.a Foley catheter was inserted into the bladder under sterile conditions and connected to straight drainage. The abdomen was draped as a sterile field.  Suprapubic injection of 10 cc of quarter percent Marcaine was undertaken. A suprapubic incision was made and the abdomen opened in layers. Peritoneum was entered and after washings were obtained and the upper abdomen examined,  a self-retaining retractor placed in the abdominal cavity. A bladder blade was placed. After visual and palpable intraperitoneal  evaluation of the anatomy was carried out large Kelly clamps were placed at the cornual regions of the uterus, and the uterus elevated through the incision. The left round ligament was then identified clamped cut and suture ligated. That incision was taken anteriorly on the anterior leaf of the broad ligament. The utero-ovarian ligament was identified clamped cut, free tied and suture-ligated. A similar procedure was carried out on the opposite side with the round ligament and utero-ovarian ligament. The bladder flap was completed on the opposite side with incision of the anterior leaf of the broad ligament. The bladder was dissected off the anterior cervix with a combination of blunt and sharp dissection. The uterine arteries on the right and left side were clamped cut and suture ligated, after being skeletonized.  Parametial tissues on the right and left side were clamped, cut and suture-ligated down to the level of the cervix. The cervical tissues were then clamped cut and suture ligated. Uterosacral ligaments were clamped cut suture-ligated and those sutures held. The vaginal angles were clamped cut suture ligated and the sutures held. The remainder of the vagina was incised and the uterus and cervix were removed from the operative field. Vaginal cuff was closed with figure-of-eight sutures of 0-Vicryl.   Copious irrigation was carried out. The sutures holding the vaginal angles and uterosacral ligaments were then tied together.  The fallopian tubes on the left was skeletonized, clamped, and excised then the base suture ligated to achieve hemostasis.  A similar procedure was carried out on the opposite side.  Both tubes were removed from the operative field.  Hemostasis was noted to be adequate and all instruments were removed from the peritoneal cavity.  The abdominal  peritoneum was closed using running suture of 2-0 Vicryl.  The rectus fascia was closed with running sutures of 0-Vicryl, from each apex to  the  midline and tied in the midline. The subcutaneous tissue was made hemostatic with Bovie cautery and irrigated. The skin incision was closed with a subcuticular suture of 3-0 Monocryl. Dermabond was applied. The patient was awakened from general anesthesia and taken to the recovery room in satisfactory condition having tolerated the procedure well with  sponge and instrument counts correct.  PLAN OF CARE: Admit  PATIENT DISPOSITION:  PACU - hemodynamically stable.   Delay start of Pharmacological VTE agent (>24hrs) due to surgical blood loss or risk of bleeding:  Yes.  SCDs were used during surgery and post op.   Joan Riley P 10:53 AM

## 2011-10-19 NOTE — Anesthesia Postprocedure Evaluation (Signed)
  Anesthesia Post-op Note  Patient: Joan Riley  Procedure(s) Performed: Procedure(s) (LRB) with comments: HYSTERECTOMY ABDOMINAL (N/A) - total abdominal hysterectomy with Bilateral Salpingectomy  Patient Location: PACU  Anesthesia Type: General  Level of Consciousness: awake, alert  and oriented  Airway and Oxygen Therapy: Patient Spontanous Breathing  Post-op Pain: mild  Post-op Assessment: Post-op Vital signs reviewed, Patient's Cardiovascular Status Stable, Respiratory Function Stable, Patent Airway, No signs of Nausea or vomiting and Pain level controlled  Post-op Vital Signs: Reviewed and stable  Complications: No apparent anesthesia complications

## 2011-10-19 NOTE — Transfer of Care (Signed)
Immediate Anesthesia Transfer of Care Note  Patient: Joan Riley  Procedure(s) Performed: Procedure(s) (LRB) with comments: HYSTERECTOMY ABDOMINAL (N/A) - total abdominal hysterectomy with Bilateral Salpingectomy  Patient Location: PACU  Anesthesia Type: General  Level of Consciousness: awake  Airway & Oxygen Therapy: Patient Spontanous Breathing and Patient connected to face mask oxygen  Post-op Assessment: Report given to PACU RN and Post -op Vital signs reviewed and stable  Post vital signs: stable  Complications: No apparent anesthesia complications

## 2011-10-19 NOTE — Anesthesia Postprocedure Evaluation (Signed)
  Anesthesia Post-op Note  Patient: Joan Riley  Procedure(s) Performed: Procedure(s) (LRB) with comments: HYSTERECTOMY ABDOMINAL (N/A) - total abdominal hysterectomy with Bilateral Salpingectomy  Patient Location: [facility]  Anesthesia Type: General  Level of Consciousness: awake  Airway and Oxygen Therapy: Patient Spontanous Breathing  Post-op Pain: mild  Post-op Assessment: Patient's Cardiovascular Status Stable and Respiratory Function Stable  Post-op Vital Signs: stable  Complications: No apparent anesthesia complications

## 2011-10-19 NOTE — OR Nursing (Signed)
Uterus and cervix weight 830.2 grams

## 2011-10-19 NOTE — Anesthesia Preprocedure Evaluation (Signed)
Anesthesia Evaluation  Patient identified by MRN, date of birth, ID band Patient awake    Reviewed: Allergy & Precautions, H&P , Patient's Chart, lab work & pertinent test results  Airway Mallampati: II TM Distance: >3 FB Neck ROM: full    Dental No notable dental hx.    Pulmonary neg pulmonary ROS,  breath sounds clear to auscultation  Pulmonary exam normal       Cardiovascular negative cardio ROS  Rhythm:regular Rate:Normal     Neuro/Psych PSYCHIATRIC DISORDERS Anxiety negative neurological ROS  negative psych ROS   GI/Hepatic negative GI ROS, Neg liver ROS,   Endo/Other  negative endocrine ROS  Renal/GU negative Renal ROS     Musculoskeletal   Abdominal   Peds  Hematology negative hematology ROS (+)   Anesthesia Other Findings   Reproductive/Obstetrics (+) Pregnancy                           Anesthesia Physical Anesthesia Plan  ASA: II  Anesthesia Plan: General ETT   Post-op Pain Management:    Induction:   Airway Management Planned:   Additional Equipment:   Intra-op Plan:   Post-operative Plan:   Informed Consent: I have reviewed the patients History and Physical, chart, labs and discussed the procedure including the risks, benefits and alternatives for the proposed anesthesia with the patient or authorized representative who has indicated his/her understanding and acceptance.     Plan Discussed with:   Anesthesia Plan Comments:         Anesthesia Quick Evaluation

## 2011-10-19 NOTE — Anesthesia Procedure Notes (Signed)
Procedure Name: Intubation Date/Time: 10/19/2011 7:45 AM Performed by: Lincoln Brigham Pre-anesthesia Checklist: Patient identified, Timeout performed, Emergency Drugs available, Suction available and Patient being monitored Patient Re-evaluated:Patient Re-evaluated prior to inductionOxygen Delivery Method: Circle system utilized Preoxygenation: Pre-oxygenation with 100% oxygen Intubation Type: IV induction Ventilation: Mask ventilation without difficulty Laryngoscope Size: Miller and 2 Grade View: Grade I Tube type: Oral Tube size: 7.0 mm Number of attempts: 1 Airway Equipment and Method: Stylet Placement Confirmation: ETT inserted through vocal cords under direct vision,  positive ETCO2 and breath sounds checked- equal and bilateral Secured at: 21 cm Tube secured with: Tape Dental Injury: Teeth and Oropharynx as per pre-operative assessment

## 2011-10-20 ENCOUNTER — Encounter (HOSPITAL_COMMUNITY): Payer: Self-pay | Admitting: Obstetrics and Gynecology

## 2011-10-20 LAB — CBC
HCT: 35.8 % — ABNORMAL LOW (ref 36.0–46.0)
MCH: 26 pg (ref 26.0–34.0)
MCHC: 31.6 g/dL (ref 30.0–36.0)
MCV: 82.5 fL (ref 78.0–100.0)
RDW: 13.9 % (ref 11.5–15.5)

## 2011-10-20 MED ORDER — INFLUENZA VIRUS VACC SPLIT PF IM SUSP
0.5000 mL | INTRAMUSCULAR | Status: AC
  Administered 2011-10-21: 0.5 mL via INTRAMUSCULAR
  Filled 2011-10-20: qty 0.5

## 2011-10-20 MED FILL — Heparin Sodium (Porcine) Inj 5000 Unit/ML: INTRAMUSCULAR | Qty: 1 | Status: AC

## 2011-10-20 NOTE — Progress Notes (Signed)
1 Day Post-Op Procedure(s) (LRB): HYSTERECTOMY ABDOMINAL (N/A)  Subjective: Patient reports no nausea, vomiting and incisional pain. She has ambulated in the room, and tolerated liquids and fruit last night Objective: I have reviewed patient's vital signs, intake and output and labs.  General: alert, cooperative, no distress and moderately obese Resp: clear to auscultation bilaterally Cardio: regular rate and rhythm, S1, S2 normal, no murmur, click, rub or gallop GI: soft, non-tender; bowel sounds normal; no masses,  no organomegaly and incision: clean, dry and intact Extremities: extremities normal, atraumatic, no cyanosis or edema and Homans sign is negative, no sign of DVT   Hemoglobin & Hematocrit     Component Value Date/Time   HGB 11.3* 10/20/2011 0530   HCT 35.8* 10/20/2011 0530    Assessment: s/p Procedure(s): HYSTERECTOMY ABDOMINAL: stable, progressing well and tolerating diet  Plan: Advance diet Encourage ambulation Advance to PO medication Discharge home on 10/21/11  LOS: 1 day    HAYGOOD,VANESSA P 10/20/2011, 8:54 AM

## 2011-10-20 NOTE — Progress Notes (Signed)
Ur chart review completed.  

## 2011-10-21 MED ORDER — DOCUSATE SODIUM 100 MG PO CAPS: 100.0000 mg | ORAL_CAPSULE | Freq: Two times a day (BID) | ORAL | Status: DC

## 2011-10-21 MED ORDER — IBUPROFEN 600 MG PO TABS: ORAL_TABLET | ORAL | Status: DC

## 2011-10-21 MED ORDER — IBUPROFEN 600 MG PO TABS: 600.0000 mg | ORAL_TABLET | Freq: Four times a day (QID) | ORAL | Status: DC | PRN

## 2011-10-21 MED ORDER — ONDANSETRON HCL 4 MG PO TABS: 4.0000 mg | ORAL_TABLET | Freq: Three times a day (TID) | ORAL | Status: DC | PRN

## 2011-10-21 MED ORDER — OXYCODONE-ACETAMINOPHEN 5-325 MG PO TABS: 1.0000 | ORAL_TABLET | Freq: Four times a day (QID) | ORAL | Status: DC | PRN

## 2011-10-21 NOTE — Progress Notes (Signed)
Joan Riley is a63 y.o.  [id number]  Post Op Date # 2  Subjective: Patient is Doing well postoperatively. Patient has The patient is not having any pain.,  is ambulating and voiding without difficulty and is tolerating a regular diet.   Objective: Vital signs in last 24 hours: Temp:  [98 F (36.7 C)-99.1 F (37.3 C)] 98.1 F (36.7 C) (09/19 0518) Pulse Rate:  [84-108] 94  (09/19 0518) Resp:  [16-18] 18  (09/19 0518) BP: (134-148)/(68-79) 135/79 mmHg (09/19 0518) SpO2:  [99 %-100 %] 100 % (09/19 0518)  Intake/Output from previous day: 09/18 0701 - 09/19 0700 In: 240 [P.O.:240] Out: 650 [Urine:650] Intake/Output this shift: Total I/O In: -  Out: 300 [Urine:300]  Lab 10/20/11 0530 10/15/11 1336  WBC 10.8* 5.9  HGB 11.3* 12.7  HCT 35.8* 40.3  PLT 234 252    No results found for this basename: NA:3,K:3,CL:3,CO2:3,BUN:3,CREATININE:3,CALCIUM:3,LABALBU:3,PROT:3,BILITOT:3,ALKPHOS:3,ALT:3,AST:3,GLUCOSE:3 in the last 168 hours  EXAM: General: alert, cooperative and no distress Resp: clear to auscultation bilaterally Cardio: regular rate and rhythm GI: soft, bowel sounds present in all four quadrants; incision intact without evidence of infection Extremities: no calf tenderness and negative Homan's sign    Assessment: s/p Procedure(s): HYSTERECTOMY ABDOMINAL/Bilateral Salpingectomy:: stable and progressing well  Plan: Discharge home  LOS: 2 days    Stefanos Haynesworth, PA-C 10/21/2011 6:32 AM

## 2011-10-21 NOTE — Discharge Summary (Signed)
  Physician Discharge Summary  Patient ID: Joan Riley MRN: [id number] DOB/AGE: February 24, 1970 41 y.o.  Admit date: 10/19/2011 Discharge date: 10/21/2011   Discharge Diagnoses: Symptomatic Uterine Fibroids Active Problems:  * No active hospital problems. *    Operation:  Total Abdominal Hysterectomy with Bilateral Salpingectomy   Discharged Condition: Stable  Hospital Course: On the date of admission, the patient underwent aforementioned procedures and tolerated them well.  Post operative course was unremarkable with patient resuming bowel and bladder function by post operative day #2 and was therefore deemed ready for discharge home.  Disposition: 01-Home or Self Care  Discharge Medications:   Cynthie, Garmon  Home Medication Instructions MVH:[id number]   Printed on:10/21/11 8413  Medication Information                    amoxicillin (AMOXIL) 500 MG capsule Take 500 mg by mouth 4 (four) times daily.           ibuprofen (ADVIL,MOTRIN) 600 MG tablet Take 1 tablet (600 mg total) by mouth every 6 (six) hours as needed for pain (mild pain).           ondansetron (ZOFRAN) 4 MG tablet Take 1 tablet (4 mg total) by mouth every 8 (eight) hours as needed.           oxyCODONE-acetaminophen (PERCOCET/ROXICET) 5-325 MG per tablet Take 1-2 tablets by mouth every 6 (six) hours as needed (moderate to severe pain (when tolerating fluids)).              Follow-up:  Dr. Pennie Rushing,  November 29, 2011 at 8:45 a.m.   SignedHenreitta Leber, PA-C 10/21/2011, 6:44 AM

## 2011-11-29 ENCOUNTER — Ambulatory Visit (INDEPENDENT_AMBULATORY_CARE_PROVIDER_SITE_OTHER): Payer: BC Managed Care – PPO | Admitting: Obstetrics and Gynecology

## 2011-11-29 ENCOUNTER — Encounter: Payer: Self-pay | Admitting: Obstetrics and Gynecology

## 2011-11-29 VITALS — BP 142/78 | HR 72 | Temp 99.2°F | Resp 14 | Ht 66.0 in | Wt 205.0 lb

## 2011-11-29 DIAGNOSIS — Z90710 Acquired absence of both cervix and uterus: Secondary | ICD-10-CM | POA: Insufficient documentation

## 2011-11-29 DIAGNOSIS — D219 Benign neoplasm of connective and other soft tissue, unspecified: Secondary | ICD-10-CM

## 2011-11-29 DIAGNOSIS — D259 Leiomyoma of uterus, unspecified: Secondary | ICD-10-CM

## 2011-11-29 NOTE — Progress Notes (Signed)
POST-OP:   Subjective:     Joan Riley is a 41 y.o. female who presents for post-op visit.  DATE OF SURGERY: 10/19/2011 TYPE OF SURGERY: TAH PAIN:No VAG BLEEDING: no VAG DISCHARGE: no NORMAL GI FUNCTN: yes NORMAL GU FUNCTN: yes   Pathology report:  was reviewed with patient.  Uterus and bilateral fallopian tubes, with fibroids - CERVIX: SQUAMOUS METAPLASIA. - ENDOMETRIUM: SECRETORY. NO EVIDENCE OF HYPERPLASIA OR CARCINOMA. - MYOMETRIUM: LEIOMYOMATA. - RIGHT AND LEFT FALLOPIAN TUBES: UNREMARKABLE.  The following portions of the patient's history were reviewed and updated as appropriate: allergies, current medications, past family history, past medical history, past social history, past surgical history and problem list.  Review of Systems Pertinent items are noted in HPI.   Objective:    There were no vitals taken for this visit. Weight:  Wt Readings from Last 1 Encounters:  10/19/11 206 lb (93.441 kg)    BMI: There is no height or weight on file to calculate BMI.  General Appearance: Alert, appropriate appearance for age. No acute distress Lungs: clear to auscultation bilaterally Back: No CVA tenderness Cardiovascular: Regular rate and rhythm. S1, S2, no murmur Gastrointestinal: Soft, non-tender, no masses or organomegaly Incision/s: healing well Pelvic Exam: vaginal cuff healing well   Bimanual exam normal  Assessment:    Doing well postoperatively. Operative findings again reviewed.   Plan:   RTW 11/30/11 Gradually increase all activities F/u 6 mos  Dierdre Forth MD 10/28/20138:56 AM

## 2012-02-13 ENCOUNTER — Encounter (HOSPITAL_BASED_OUTPATIENT_CLINIC_OR_DEPARTMENT_OTHER): Payer: Self-pay | Admitting: *Deleted

## 2012-02-13 ENCOUNTER — Emergency Department (HOSPITAL_BASED_OUTPATIENT_CLINIC_OR_DEPARTMENT_OTHER): Payer: BC Managed Care – PPO

## 2012-02-13 ENCOUNTER — Emergency Department (HOSPITAL_BASED_OUTPATIENT_CLINIC_OR_DEPARTMENT_OTHER)
Admission: EM | Admit: 2012-02-13 | Discharge: 2012-02-13 | Disposition: A | Discharge status: Home / Self Care | Payer: BC Managed Care – PPO | Attending: Emergency Medicine | Admitting: Emergency Medicine

## 2012-02-13 DIAGNOSIS — Z87828 Personal history of other (healed) physical injury and trauma: Secondary | ICD-10-CM | POA: Insufficient documentation

## 2012-02-13 DIAGNOSIS — M25461 Effusion, right knee: Secondary | ICD-10-CM

## 2012-02-13 DIAGNOSIS — Z8739 Personal history of other diseases of the musculoskeletal system and connective tissue: Secondary | ICD-10-CM | POA: Insufficient documentation

## 2012-02-13 DIAGNOSIS — M25469 Effusion, unspecified knee: Secondary | ICD-10-CM | POA: Insufficient documentation

## 2012-02-13 DIAGNOSIS — Z90710 Acquired absence of both cervix and uterus: Secondary | ICD-10-CM | POA: Insufficient documentation

## 2012-02-13 DIAGNOSIS — F411 Generalized anxiety disorder: Secondary | ICD-10-CM | POA: Insufficient documentation

## 2012-02-13 DIAGNOSIS — M7989 Other specified soft tissue disorders: Secondary | ICD-10-CM | POA: Insufficient documentation

## 2012-02-13 DIAGNOSIS — Z9889 Other specified postprocedural states: Secondary | ICD-10-CM | POA: Insufficient documentation

## 2012-02-13 DIAGNOSIS — Z8619 Personal history of other infectious and parasitic diseases: Secondary | ICD-10-CM | POA: Insufficient documentation

## 2012-02-13 DIAGNOSIS — Z79899 Other long term (current) drug therapy: Secondary | ICD-10-CM | POA: Insufficient documentation

## 2012-02-13 MED ORDER — HYDROCODONE-ACETAMINOPHEN 5-325 MG PO TABS
2.0000 | ORAL_TABLET | Freq: Once | ORAL | Status: AC
  Administered 2012-02-13: 2 via ORAL
  Filled 2012-02-13: qty 2

## 2012-02-13 MED ORDER — HYDROCODONE-ACETAMINOPHEN 5-325 MG PO TABS: ORAL_TABLET | ORAL | Status: DC

## 2012-02-13 MED ORDER — IBUPROFEN 800 MG PO TABS: 800.0000 mg | ORAL_TABLET | Freq: Three times a day (TID) | ORAL | Status: DC

## 2012-02-13 NOTE — Discharge Instructions (Signed)
 Knee Effusion The medical term for having fluid in your knee is effusion. This is often due to an internal derangement of the knee. This means something is wrong inside the knee. Some of the causes of fluid in the knee may be torn cartilage, a torn ligament, or bleeding into the joint from an injury. Your knee is likely more difficult to bend and move. This is often because there is increased pain and pressure in the joint. The time it takes for recovery from a knee effusion depends on different factors, including:   Type of injury.  Your age.  Physical and medical conditions.  Rehabilitation Strategies. How long you will be away from your normal activities will depend on what kind of knee problem you have and how much damage is present. Your knee has two types of cartilage. Articular cartilage covers the bone ends and lets your knee bend and move smoothly. Two menisci, thick pads of cartilage that form a rim inside the joint, help absorb shock and stabilize your knee. Ligaments bind the bones together and support your knee joint. Muscles move the joint, help support your knee, and take stress off the joint itself. CAUSES  Often an effusion in the knee is caused by an injury to one of the menisci. This is often a tear in the cartilage. Recovery after a meniscus injury depends on how much meniscus is damaged and whether you have damaged other knee tissue. Small tears may heal on their own with conservative treatment. Conservative means rest, limited weight bearing activity and muscle strengthening exercises. Your recovery may take up to 6 weeks.  TREATMENT  Larger tears may require surgery. Meniscus injuries may be treated during arthroscopy. Arthroscopy is a procedure in which your surgeon uses a small telescope like instrument to look in your knee. Your caregiver can make a more accurate diagnosis (learning what is wrong) by performing an arthroscopic procedure. If your injury is on the inner margin  of the meniscus, your surgeon may trim the meniscus back to a smooth rim. In other cases your surgeon will try to repair a damaged meniscus with stitches (sutures). This may make rehabilitation take longer, but may provide better long term result by helping your knee keep its shock absorption capabilities. Ligaments which are completely torn usually require surgery for repair. HOME CARE INSTRUCTIONS  Use crutches as instructed.  If a brace is applied, use as directed.  Once you are home, an ice pack applied to your swollen knee may help with discomfort and help decrease swelling.  Keep your knee raised (elevated) when you are not up and around or on crutches.  Only take over-the-counter or prescription medicines for pain, discomfort, or fever as directed by your caregiver.  Your caregivers will help with instructions for rehabilitation of your knee. This often includes strengthening exercises.  You may resume a normal diet and activities as directed. SEEK MEDICAL CARE IF:   There is increased swelling in your knee.  You notice redness, swelling, or increasing pain in your knee.  An unexplained oral temperature above 102 F (38.9 C) develops. SEEK IMMEDIATE MEDICAL CARE IF:   You develop a rash.  You have difficulty breathing.  You have any allergic reactions from medications you may have been given.  There is severe pain with any motion of the knee. MAKE SURE YOU:   Understand these instructions.  Will watch your condition.  Will get help right away if you are not doing well or get worse.  Document Released: 04/10/2003 Document Revised: 04/12/2011 Document Reviewed: 06/14/2007 [facility] Patient Information 2013 [place], [place].    Narcotic and benzodiazepine use may cause drowsiness, slowed breathing or dependence.  Please use with caution and do not drive, operate machinery or watch young children alone while taking them.  Taking combinations of these medications or  drinking alcohol will potentiate these effects.

## 2012-02-13 NOTE — ED Provider Notes (Signed)
History   This chart was scribed for Joan Riley. Oletta Lamas, MD by Donne Anon, ED Scribe. This patient was seen in room MH04/MH04 and the patient's care was started at 1806.     CSN: [id number]  Arrival date & time 02/13/12  1633   First MD Initiated Contact with Patient 02/13/12 1806      Chief Complaint  Patient presents with  . Knee Pain     The history is provided by the patient. No language interpreter was used.   MADELYNN Riley is a 42 y.o. female who presents to the Emergency Department complaining of gradual onset, moderate, constant, dull and throbbing right knee pain with associated swelling and stiffness which began 3 weeks ago. She denies any recent trauma to her knee. She has tried Motrin with no relief. She had a hysterectomy a year ago and orthroscopic knee surgery in high school for a sports injury.  Past Medical History  Diagnosis Date  . Abnormal Pap smear 1996    CRYOTHERAPY  . Chlamydia infection   . H/O varicella   . Anxiety     currently with surgery  . Arthritis     joint swelling right knee-occasionally    Past Surgical History  Procedure Date  . Knee surgery 1991    arthroscopy-right  . Tubal ligation   . Wisdom tooth extraction   . Abdominal hysterectomy 10/19/2011    Procedure: HYSTERECTOMY ABDOMINAL;  Surgeon: Hal Morales, MD;  Location: [facility];  Service: Gynecology;  Laterality: N/A;  total abdominal hysterectomy with Bilateral Salpingectomy    Family History  Problem Relation Age of Onset  . Cancer Paternal Grandmother   . Heart disease Maternal Grandmother   . Hypertension Maternal Grandmother   . Hypertension Mother   . Asthma Son   . Thyroid disease Maternal Aunt   . Thyroid disease Maternal Aunt   . Cancer Paternal Aunt     Lung    History  Substance Use Topics  . Smoking status: Never Smoker   . Smokeless tobacco: Never Used  . Alcohol Use: Yes     Comment: occasional    OB History    Grav Para Term Preterm  Abortions TAB SAB Ect Mult Living   2 2 1 1      2       Review of Systems  Constitutional: Negative for fever.  Cardiovascular: Negative for chest pain.  Musculoskeletal: Positive for joint swelling and arthralgias.  Skin: Negative for color change, rash and wound.  Neurological: Negative for weakness and numbness.    Allergies  Sulfa antibiotics  Home Medications   Current Outpatient Rx  Name  Route  Sig  Dispense  Refill  . AMOXICILLIN 500 MG PO CAPS   Oral   Take 500 mg by mouth 4 (four) times daily.         Marland Kitchen DOCUSATE SODIUM 100 MG PO CAPS   Oral   Take 1 capsule (100 mg total) by mouth 2 (two) times daily.   10 capsule   0   . HYDROCODONE-ACETAMINOPHEN 5-325 MG PO TABS      1-2 tablets po q 6 hours prn moderate to severe pain   20 tablet   0   . IBUPROFEN 600 MG PO TABS      Take 600mg  every 6 hours around the clock for 3 days, then every 6 hours as needed for pain   30 tablet   1   . IBUPROFEN 800 MG  PO TABS   Oral   Take 1 tablet (800 mg total) by mouth 3 (three) times daily with meals.   21 tablet   0   . ONDANSETRON HCL 4 MG PO TABS   Oral   Take 1 tablet (4 mg total) by mouth every 8 (eight) hours as needed.   20 tablet   0   . OXYCODONE-ACETAMINOPHEN 5-325 MG PO TABS   Oral   Take 1-2 tablets by mouth every 6 (six) hours as needed (moderate to severe pain (when tolerating fluids)).   30 tablet   0     Triage Vitals: BP 172/75  Pulse 92  Temp 98.8 F (37.1 C) (Oral)  Resp 18  SpO2 100%  LMP 10/19/2011  Physical Exam  Nursing note and vitals reviewed. Constitutional: She is oriented to person, place, and time. She appears well-developed and well-nourished. No distress.  HENT:  Head: Normocephalic and atraumatic.  Eyes: EOM are normal.  Neck: Normal range of motion. Neck supple. No tracheal deviation present.  Cardiovascular: Normal rate.   Pulmonary/Chest: Effort normal. No respiratory distress.  Musculoskeletal: She exhibits  edema and tenderness.       Prepatellar mild tenderness of the right knee. No obvious deformity. Limited ROM due to pain. Ligaments are stable. Distal strength and sensation are all normal. Neg Homan's  Neurological: She is alert and oriented to person, place, and time.  Skin: Skin is warm and dry.  Psychiatric: She has a normal mood and affect. Her behavior is normal.    ED Course  Procedures (including critical care time) DIAGNOSTIC STUDIES: Oxygen Saturation is 100% on room air, normal by my interpretation.    COORDINATION OF CARE: 6:21 PM Discussed treatment plan which includes Motrin, compression, crutches ice and to follow up with an orthopedist with pt at bedside and she agreed to plan.     Labs Reviewed - No data to display Dg Knee Complete 4 Views Right  02/13/2012  *RADIOLOGY REPORT*  Clinical Data: Pain without recent trauma.  RIGHT KNEE - COMPLETE 4+ VIEW  Comparison: 03/01/2009  Findings: Small spur from the inferior margin of the patellar articular surface as before.  Small effusion in the   suprapatellar bursa.  Normal alignment.  Negative for fracture, dislocation, or other acute bony abnormality.  IMPRESSION:  1.  Small effusion without acute bony abnormality. 2.  Stable small patellar spurring.   Original Report Authenticated By: D. Andria Rhein, MD      1. Knee effusion, right       MDM  I personally performed the services described in this documentation, which was scribed in my presence. The recorded information has been reviewed and is accurate.  Patient with atraumatic knee pain that has been gradually worsening. Plain films suggest small effusion. This likely is cause of her pain. No fractures are identified on plain films which are reviewed myself and reviewed with the patient. Encouraged rest, ice, elevation and compression. Plan is to provide knee immobilizer and crutches. She is encouraged to followup with her own orthopedist with [facility] or so and also  provided referral to Dr. Pearletha Forge upstairs if desired. Prescription for  analgesics are given.        Joan Riley. Oletta Lamas, MD 02/13/12 2342

## 2012-02-13 NOTE — ED Notes (Signed)
Right knee pain and swelling. Has had surgery long ago on that knee. States she thinks she has fluid

## 2013-12-03 ENCOUNTER — Encounter (HOSPITAL_BASED_OUTPATIENT_CLINIC_OR_DEPARTMENT_OTHER): Payer: Self-pay | Admitting: *Deleted

## 2014-07-26 ENCOUNTER — Other Ambulatory Visit: Payer: Self-pay | Admitting: Family Medicine

## 2014-07-26 DIAGNOSIS — Z1231 Encounter for screening mammogram for malignant neoplasm of breast: Secondary | ICD-10-CM

## 2014-08-12 ENCOUNTER — Ambulatory Visit: Payer: Self-pay

## 2014-10-31 ENCOUNTER — Encounter (HOSPITAL_BASED_OUTPATIENT_CLINIC_OR_DEPARTMENT_OTHER): Payer: Self-pay | Admitting: Emergency Medicine

## 2014-10-31 ENCOUNTER — Emergency Department (HOSPITAL_BASED_OUTPATIENT_CLINIC_OR_DEPARTMENT_OTHER)
Admission: EM | Admit: 2014-10-31 | Discharge: 2014-10-31 | Disposition: A | Discharge status: Home / Self Care | Payer: BLUE CROSS/BLUE SHIELD | Attending: Emergency Medicine | Admitting: Emergency Medicine

## 2014-10-31 ENCOUNTER — Emergency Department (HOSPITAL_BASED_OUTPATIENT_CLINIC_OR_DEPARTMENT_OTHER): Payer: BLUE CROSS/BLUE SHIELD

## 2014-10-31 DIAGNOSIS — S0990XA Unspecified injury of head, initial encounter: Secondary | ICD-10-CM | POA: Diagnosis not present

## 2014-10-31 DIAGNOSIS — W010XXA Fall on same level from slipping, tripping and stumbling without subsequent striking against object, initial encounter: Secondary | ICD-10-CM | POA: Insufficient documentation

## 2014-10-31 DIAGNOSIS — Y9302 Activity, running: Secondary | ICD-10-CM | POA: Insufficient documentation

## 2014-10-31 DIAGNOSIS — S199XXA Unspecified injury of neck, initial encounter: Secondary | ICD-10-CM | POA: Diagnosis present

## 2014-10-31 DIAGNOSIS — M1990 Unspecified osteoarthritis, unspecified site: Secondary | ICD-10-CM | POA: Insufficient documentation

## 2014-10-31 DIAGNOSIS — Y998 Other external cause status: Secondary | ICD-10-CM | POA: Diagnosis not present

## 2014-10-31 DIAGNOSIS — Y92015 Private garage of single-family (private) house as the place of occurrence of the external cause: Secondary | ICD-10-CM | POA: Diagnosis not present

## 2014-10-31 DIAGNOSIS — Z8619 Personal history of other infectious and parasitic diseases: Secondary | ICD-10-CM | POA: Diagnosis not present

## 2014-10-31 DIAGNOSIS — S161XXA Strain of muscle, fascia and tendon at neck level, initial encounter: Secondary | ICD-10-CM | POA: Diagnosis not present

## 2014-10-31 DIAGNOSIS — Z8659 Personal history of other mental and behavioral disorders: Secondary | ICD-10-CM | POA: Diagnosis not present

## 2014-10-31 MED ORDER — CYCLOBENZAPRINE HCL 10 MG PO TABS: 10.0000 mg | ORAL_TABLET | Freq: Two times a day (BID) | ORAL | Status: DC | PRN

## 2014-10-31 MED ORDER — IBUPROFEN 600 MG PO TABS: 600.0000 mg | ORAL_TABLET | Freq: Four times a day (QID) | ORAL | Status: DC | PRN

## 2014-10-31 NOTE — Discharge Instructions (Signed)
Cervical Sprain A cervical sprain is an injury in the neck in which the strong, fibrous tissues (ligaments) that connect your neck bones stretch or tear. Cervical sprains can range from mild to severe. Severe cervical sprains can cause the neck vertebrae to be unstable. This can lead to damage of the spinal cord and can result in serious nervous system problems. The amount of time it takes for a cervical sprain to get better depends on the cause and extent of the injury. Most cervical sprains heal in 1 to 3 weeks. CAUSES  Severe cervical sprains may be caused by:   Contact sport injuries (such as from football, rugby, wrestling, hockey, auto racing, gymnastics, diving, martial arts, or boxing).   Motor vehicle collisions.   Whiplash injuries. This is an injury from a sudden forward and backward whipping movement of the head and neck.  Falls.  Mild cervical sprains may be caused by:   Being in an awkward position, such as while cradling a telephone between your ear and shoulder.   Sitting in a chair that does not offer proper support.   Working at a poorly Landscape [employer] station.   Looking up or down for long periods of time.  SYMPTOMS   Pain, soreness, stiffness, or a burning sensation in the front, back, or sides of the neck. This discomfort may develop immediately after the injury or slowly, 24 hours or more after the injury.   Pain or tenderness directly in the middle of the back of the neck.   Shoulder or upper back pain.   Limited ability to move the neck.   Headache.   Dizziness.   Weakness, numbness, or tingling in the hands or arms.   Muscle spasms.   Difficulty swallowing or chewing.   Tenderness and swelling of the neck.  DIAGNOSIS  Most of the time your health care provider can diagnose a cervical sprain by taking your history and doing a physical exam. Your health care provider will ask about previous neck injuries and any known neck  problems, such as arthritis in the neck. X-rays may be taken to find out if there are any other problems, such as with the bones of the neck. Other tests, such as a CT scan or MRI, may also be needed.  TREATMENT  Treatment depends on the severity of the cervical sprain. Mild sprains can be treated with rest, keeping the neck in place (immobilization), and pain medicines. Severe cervical sprains are immediately immobilized. Further treatment is done to help with pain, muscle spasms, and other symptoms and may include:  Medicines, such as pain relievers, numbing medicines, or muscle relaxants.   Physical therapy. This may involve stretching exercises, strengthening exercises, and posture training. Exercises and improved posture can help stabilize the neck, strengthen muscles, and help stop symptoms from returning.  HOME CARE INSTRUCTIONS   Put ice on the injured area.   Put ice in a plastic bag.   Place a towel between your skin and the bag.   Leave the ice on for 15-20 minutes, 3-4 times a day.   If your injury was severe, you may have been given a cervical collar to wear. A cervical collar is a two-piece collar designed to keep your neck from moving while it heals.  Do not remove the collar unless instructed by your health care provider.  If you have long hair, keep it outside of the collar.  Ask your health care provider before making any adjustments to your collar. Minor  adjustments may be required over time to improve comfort and reduce pressure on your chin or on the back of your head.  Ifyou are allowed to remove the collar for cleaning or bathing, follow your health care provider's instructions on how to do so safely.  Keep your collar clean by wiping it with mild soap and water and drying it completely. If the collar you have been given includes removable pads, remove them every 1-2 days and hand wash them with soap and water. Allow them to air dry. They should be completely  dry before you wear them in the collar.  If you are allowed to remove the collar for cleaning and bathing, wash and dry the skin of your neck. Check your skin for irritation or sores. If you see any, tell your health care provider.  Do not drive while wearing the collar.   Only take over-the-counter or prescription medicines for pain, discomfort, or fever as directed by your health care provider.   Keep all follow-up appointments as directed by your health care provider.   Keep all physical therapy appointments as directed by your health care provider.   Make any needed adjustments to your workstation to promote good posture.   Avoid positions and activities that make your symptoms worse.   Warm up and stretch before being active to help prevent problems.  SEEK MEDICAL CARE IF:   Your pain is not controlled with medicine.   You are unable to decrease your pain medicine over time as planned.   Your activity level is not improving as expected.  SEEK IMMEDIATE MEDICAL CARE IF:   You develop any bleeding.  You develop stomach upset.  You have signs of an allergic reaction to your medicine.   Your symptoms get worse.   You develop new, unexplained symptoms.   You have numbness, tingling, weakness, or paralysis in any part of your body.  MAKE SURE YOU:   Understand these instructions.  Will watch your condition.  Will get help right away if you are not doing well or get worse. Document Released: 11/15/2006 Document Revised: 01/23/2013 Document Reviewed: 07/26/2012 [facility] Patient Information 2015 [place], [place]. This information is not intended to replace advice given to you by your health care provider. Make sure you discuss any questions you have with your health care provider.
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
## 2015-12-21 ENCOUNTER — Emergency Department (HOSPITAL_BASED_OUTPATIENT_CLINIC_OR_DEPARTMENT_OTHER)
Admission: EM | Admit: 2015-12-21 | Discharge: 2015-12-22 | Disposition: A | Discharge status: Home / Self Care | Payer: BLUE CROSS/BLUE SHIELD | Attending: Emergency Medicine | Admitting: Emergency Medicine

## 2015-12-21 ENCOUNTER — Encounter (HOSPITAL_BASED_OUTPATIENT_CLINIC_OR_DEPARTMENT_OTHER): Payer: Self-pay | Admitting: Emergency Medicine

## 2015-12-21 ENCOUNTER — Emergency Department (HOSPITAL_BASED_OUTPATIENT_CLINIC_OR_DEPARTMENT_OTHER): Payer: BLUE CROSS/BLUE SHIELD

## 2015-12-21 DIAGNOSIS — Y9389 Activity, other specified: Secondary | ICD-10-CM | POA: Diagnosis not present

## 2015-12-21 DIAGNOSIS — W228XXA Striking against or struck by other objects, initial encounter: Secondary | ICD-10-CM | POA: Diagnosis not present

## 2015-12-21 DIAGNOSIS — S5001XA Contusion of right elbow, initial encounter: Secondary | ICD-10-CM | POA: Diagnosis not present

## 2015-12-21 DIAGNOSIS — Y990 Civilian activity done for income or pay: Secondary | ICD-10-CM | POA: Insufficient documentation

## 2015-12-21 DIAGNOSIS — Y929 Unspecified place or not applicable: Secondary | ICD-10-CM | POA: Diagnosis not present

## 2015-12-21 DIAGNOSIS — S59901A Unspecified injury of right elbow, initial encounter: Secondary | ICD-10-CM | POA: Diagnosis present

## 2015-12-21 MED ORDER — IBUPROFEN 600 MG PO TABS: 600.0000 mg | ORAL_TABLET | Freq: Four times a day (QID) | ORAL | 0 refills | Status: DC | PRN

## 2015-12-21 NOTE — ED Provider Notes (Signed)
[facility] Provider Note   CSN: [id number] Arrival date & time: 12/21/15  2301  By signing my name below, I, Reola Mosher, attest that this documentation has been prepared under the direction and in the presence of Domenic Moras, PA-C.  Electronically Signed: Reola Mosher, ED Scribe. 12/21/15. 11:42 PM.  History   Chief Complaint Chief Complaint  Patient presents with  . Arm Injury   The history is provided by the patient. No language interpreter was used.    HPI Comments: Joan Riley is a 45 y.o. female with no pertinent PMHx, who presents to the Emergency Department complaining of intermittent right shoulder pain onset yesterday. Pt states that she was at work yesterday when she accidentally struck her elbow on a metal surface, sustaining her current pain. No radiation of pain. Pt reports that her pain is precipitated and exacerbated with movement. She has been applying heat to the area with minimal relief of her symptoms. She denies numbness, weakness, or any other associated symptoms.   Past Medical History:  Diagnosis Date  . Abnormal Pap smear 1996   CRYOTHERAPY  . Anxiety    currently with surgery  . Arthritis    joint swelling right knee-occasionally  . Chlamydia infection   . H/O varicella    Patient Active Problem List   Diagnosis Date Noted  . S/P abdominal hysterectomy 11/29/2011  . Fibroids 09/06/2011   Past Surgical History:  Procedure Laterality Date  . ABDOMINAL HYSTERECTOMY  10/19/2011   Procedure: HYSTERECTOMY ABDOMINAL;  Surgeon: Eldred Manges, MD;  Location: [facility];  Service: Gynecology;  Laterality: N/A;  total abdominal hysterectomy with Bilateral Salpingectomy  . [place]   arthroscopy-right  . TUBAL LIGATION    . WISDOM TOOTH EXTRACTION     OB History    Gravida Para Term Preterm AB Living   2 2 1 1   2    SAB TAB Ectopic Multiple Live Births           2     Home Medications    Prior to  Admission medications   Medication Sig Start Date End Date Taking? Authorizing Provider  cyclobenzaprine (FLEXERIL) 10 MG tablet Take 1 tablet (10 mg total) by mouth 2 (two) times daily as needed for muscle spasms. 10/31/14   Malvin Johns, MD  ibuprofen (ADVIL,MOTRIN) 600 MG tablet Take 1 tablet (600 mg total) by mouth every 6 (six) hours as needed. 10/31/14   Malvin Johns, MD   Family History Family History  Problem Relation Age of Onset  . Cancer Paternal Grandmother   . Heart disease Maternal Grandmother   . Hypertension Maternal Grandmother   . Hypertension Mother   . Asthma Son   . Thyroid disease Maternal Aunt   . Thyroid disease Maternal Aunt   . Cancer Paternal Aunt     Lung   Social History Social History  Substance Use Topics  . Smoking status: Never Smoker  . Smokeless tobacco: Never Used  . Alcohol use Yes     Comment: occasional   Allergies   Sulfa antibiotics  Review of Systems Review of Systems  Musculoskeletal: Positive for arthralgias (right elbow).  Neurological: Negative for weakness and numbness.   Physical Exam Updated Vital Signs BP 142/73 (BP Location: Left Arm)   Pulse 80   Temp 98.3 F (36.8 C) (Oral)   Resp 19   Ht 5' 6"  (1.676 m)   Wt 215 lb (97.5 kg)  LMP 10/19/2011   SpO2 100%   BMI 34.70 kg/m   Physical Exam  Constitutional: She appears well-developed and well-nourished. No distress.  HENT:  Head: Normocephalic and atraumatic.  Eyes: Conjunctivae are normal.  Neck: Normal range of motion.  Cardiovascular: Normal rate.   Pulmonary/Chest: Effort normal.  Abdominal: She exhibits no distension.  Musculoskeletal: Normal range of motion. She exhibits tenderness.  Right elbow with tenderness noted to the medial condyle on palpation. No crepitus or deformity. Normal flexion of the elbow.   Neurological: She is alert.  Skin: No pallor.  Psychiatric: She has a normal mood and affect. Her behavior is normal.  Nursing note and vitals  reviewed.  ED Treatments / Results  DIAGNOSTIC STUDIES: Oxygen Saturation is 100% on RA, normal by my interpretation.   COORDINATION OF CARE: 11:42 PM-Discussed next steps with pt. Pt verbalized understanding and is agreeable with the plan.   Radiology No results found.  Procedures Procedures   Medications Ordered in ED Medications - No data to display  Initial Impression / Assessment and Plan / ED Course  I have reviewed the triage vital signs and the nursing notes.  Pertinent labs & imaging results that were available during my care of the patient were reviewed by me and considered in my medical decision making (see chart for details).  Clinical Course    Pt is a 45yo female who presents to the ED with right shoulder pain after striking her elbow on a metal desk yesterday. Pt is tender over the right medial condyle, but otherwise exam is benign. No crepitus, deformity, or other cause for concerns. I do not feel the XR of the joint is needed, and pt agreed with this. Will d/c on symptomatic therapy. Pt is comfortable with above plan and is stable for discharge at this time. All questions were answered prior to disposition. Strict return precautions for return into the ED were discussed.   Final Clinical Impressions(s) / ED Diagnoses   Final diagnoses:  Contusion of right elbow, initial encounter   New Prescriptions Current Discharge Medication List     I personally performed the services described in this documentation, which was scribed in my presence. The recorded information has been reviewed and is accurate.       Domenic Moras, PA-C 12/21/15 [place], MD 12/22/15 2129

## 2015-12-21 NOTE — ED Triage Notes (Signed)
Patient reports right elbow injury after hitting it on drawer yesterday.  States it feels like something is sticking in her right elbow when she moves it around.

## 2015-12-22 NOTE — ED Notes (Signed)
Pt given d/c instructions as per chart. Rx x 1. Verbalizes understanding. No questions.

## 2016-10-07 ENCOUNTER — Encounter (INDEPENDENT_AMBULATORY_CARE_PROVIDER_SITE_OTHER): Payer: Self-pay | Admitting: Orthopedic Surgery

## 2016-10-07 ENCOUNTER — Ambulatory Visit (INDEPENDENT_AMBULATORY_CARE_PROVIDER_SITE_OTHER): Payer: BLUE CROSS/BLUE SHIELD

## 2016-10-07 ENCOUNTER — Ambulatory Visit (INDEPENDENT_AMBULATORY_CARE_PROVIDER_SITE_OTHER): Payer: BLUE CROSS/BLUE SHIELD | Admitting: Orthopedic Surgery

## 2016-10-07 DIAGNOSIS — M25561 Pain in right knee: Secondary | ICD-10-CM

## 2016-10-07 DIAGNOSIS — G8929 Other chronic pain: Secondary | ICD-10-CM | POA: Diagnosis not present

## 2016-10-07 NOTE — Progress Notes (Signed)
Office Visit Note   Patient: Joan Riley           Date of Birth: [birth year]           MRN: [id number] Visit Date: 10/07/2016 Requested by: No referring provider defined for this encounter. PCP: System, Pcp Not In  Subjective: Chief Complaint  Patient presents with  . Right Knee - Pain    HPI: Joan Riley is a 46 year old patient with right knee pain. She states that her paiin has been much worse over the last couple weeks.she walks on concrete at work all day.  She reports aching at night.  She describes swelling.  Denies any mechanical symptoms.  She takes ibuprofen for pain.  She had arthroscopy in high school for what sounds like cartilage damage.  She wears steel toes shoes at work at [employer]  she has had previous cortisone injections.             ROS: All systems reviewed are negative as they relate to the chief complaint within the history of present illness.  Patient denies  fevers or chills.   Assessment & Plan: Visit Diagnoses:  1. Chronic pain of right knee     Plan: impression is right knee pain without effusion or mechanical symptoms.  Radiographs show maintenance of joint space.  I would like to pre-approve her for gel injection.  That may help with some of the symptoms.  If not then further imaging would be indicated.  No imaging indicated at this time because of lack of effusion and lack of mechanical symptoms  Follow-Up Instructions: No Follow-up on file.   Orders:  Orders Placed This Encounter  Procedures  . XR KNEE 3 VIEW RIGHT   No orders of the defined types were placed in this encounter.     Procedures: No procedures performed   Clinical Data: No additional findings.  Objective: Vital Signs: LMP 10/19/2011   Physical Exam:   Constitutional: Patient appears well-developed HEENT:  Head: Normocephalic Eyes:EOM are normal Neck: Normal range of motion Cardiovascular: Normal rate Pulmonary/chest: Effort normal Neurologic: Patient  is alert Skin: Skin is warm Psychiatric: Patient has normal mood and affect    Ortho Exam: orthopedic exam demonstrates full active and passive range of motion of the right knee with no effusion.  Collateral and cruciate ligaments are stable.  Extensor mechanism is intact and nontender.  There is no focal joint line tenderness.  Mild patellofemoral crepitus is present  Specialty Comments:  No specialty comments available.  Imaging: Xr Knee 3 View Right  Result Date: 10/07/2016 AP lateral merchant right knee reviewed.  Slight varus alignment present.  Bone quality appears good.  No soft tissue calcifications noted.  Only minimal medial joint space narrowing is present.  No spurring or significant arthritic change in the medial or lateral compartments.  There is some lateral facet patella wear noted on the merchant view    PMFS History: Patient Active Problem List   Diagnosis Date Noted  . S/P abdominal hysterectomy 11/29/2011  . Fibroids 09/06/2011   Past Medical History:  Diagnosis Date  . Abnormal Pap smear 1996   CRYOTHERAPY  . Anxiety    currently with surgery  . Arthritis    joint swelling right knee-occasionally  . Chlamydia infection   . H/O varicella     Family History  Problem Relation Age of Onset  . Cancer Paternal Grandmother   . Heart disease Maternal Grandmother   . Hypertension Maternal Grandmother   .  Hypertension Mother   . Asthma Son   . Thyroid disease Maternal Aunt   . Thyroid disease Maternal Aunt   . Cancer Paternal Aunt        Lung    Past Surgical History:  Procedure Laterality Date  . ABDOMINAL HYSTERECTOMY  10/19/2011   Procedure: HYSTERECTOMY ABDOMINAL;  Surgeon: Eldred Manges, MD;  Location: [facility];  Service: Gynecology;  Laterality: N/A;  total abdominal hysterectomy with Bilateral Salpingectomy  . [place]   arthroscopy-right  . TUBAL LIGATION    . WISDOM TOOTH EXTRACTION     Social History   Occupational History  .  Not on file.   Social History Main Topics  . Smoking status: Never Smoker  . Smokeless tobacco: Never Used  . Alcohol use Yes     Comment: occasional  . Drug use: No  . Sexual activity: Yes    Birth control/ protection: Surgical     Comment: BTL

## 2016-10-12 ENCOUNTER — Telehealth (INDEPENDENT_AMBULATORY_CARE_PROVIDER_SITE_OTHER): Payer: Self-pay

## 2016-10-12 NOTE — Telephone Encounter (Signed)
Insurance will cover at 100% after $30 copay for patients synvisc one injection. I tried calling her to schedule. No answer. LMVM for her to call us back to get scheduled for this. If she calls back please put in notes patient is coming in for synvisc one injection right knee buy and bill

## 2016-12-19 ENCOUNTER — Emergency Department (HOSPITAL_BASED_OUTPATIENT_CLINIC_OR_DEPARTMENT_OTHER): Payer: BLUE CROSS/BLUE SHIELD

## 2016-12-19 ENCOUNTER — Other Ambulatory Visit: Payer: Self-pay

## 2016-12-19 ENCOUNTER — Emergency Department (HOSPITAL_BASED_OUTPATIENT_CLINIC_OR_DEPARTMENT_OTHER)
Admission: EM | Admit: 2016-12-19 | Discharge: 2016-12-19 | Disposition: A | Discharge status: Home / Self Care | Payer: BLUE CROSS/BLUE SHIELD | Attending: Emergency Medicine | Admitting: Emergency Medicine

## 2016-12-19 ENCOUNTER — Encounter (HOSPITAL_BASED_OUTPATIENT_CLINIC_OR_DEPARTMENT_OTHER): Payer: Self-pay | Admitting: *Deleted

## 2016-12-19 DIAGNOSIS — S93602A Unspecified sprain of left foot, initial encounter: Secondary | ICD-10-CM | POA: Diagnosis not present

## 2016-12-19 DIAGNOSIS — Y9301 Activity, walking, marching and hiking: Secondary | ICD-10-CM | POA: Insufficient documentation

## 2016-12-19 DIAGNOSIS — Y929 Unspecified place or not applicable: Secondary | ICD-10-CM | POA: Diagnosis not present

## 2016-12-19 DIAGNOSIS — Y999 Unspecified external cause status: Secondary | ICD-10-CM | POA: Insufficient documentation

## 2016-12-19 DIAGNOSIS — W108XXA Fall (on) (from) other stairs and steps, initial encounter: Secondary | ICD-10-CM | POA: Diagnosis not present

## 2016-12-19 DIAGNOSIS — S99922A Unspecified injury of left foot, initial encounter: Secondary | ICD-10-CM | POA: Diagnosis present

## 2016-12-19 MED ORDER — PREDNISONE 20 MG PO TABS: ORAL_TABLET | ORAL | 0 refills | Status: DC

## 2016-12-19 MED ORDER — TRAMADOL HCL 50 MG PO TABS
50.0000 mg | ORAL_TABLET | Freq: Four times a day (QID) | ORAL | 0 refills | Status: DC | PRN
Start: 2016-12-19 — End: 2021-04-18

## 2016-12-19 NOTE — ED Notes (Signed)
Pt given d/c instructions as per chart. Rx x 2 with precautions. Verbalizes understanding. No questions.

## 2016-12-19 NOTE — ED Triage Notes (Signed)
Pt reports pain on top of left foot x 1 week. Denies specific injury. States she did "slip down the stairs" before the pain started but wasn't injured

## 2016-12-19 NOTE — ED Provider Notes (Signed)
[facility] EMERGENCY DEPARTMENT Provider Note   CSN: [id number] Arrival date & time: 12/19/16  0034     History   Chief Complaint Chief Complaint  Patient presents with  . Foot Pain    HPI Joan Riley is a 46 y.o. female.  Patient is a 46 year old female with a history of arthritis who presents with left foot swelling.  She states that she has had some pain and swelling to her left foot for about a week.  It hurts to walk on it.  She states 2-3 days prior to the pain and swelling, she had a slight injury where she slipped on the floor and twisted her foot.  She has been using Aleve without improvement in symptoms.  She also has a family history of gout but denies any prior joint inflammation.  She denies any other current joint inflammation.  She denies any fevers.  No numbness or weakness in the foot.        Past Medical History:  Diagnosis Date  . Abnormal Pap smear 1996   CRYOTHERAPY  . Anxiety    currently with surgery  . Arthritis    joint swelling right knee-occasionally  . Chlamydia infection   . H/O varicella     Patient Active Problem List   Diagnosis Date Noted  . S/P abdominal hysterectomy 11/29/2011  . Fibroids 09/06/2011    Past Surgical History:  Procedure Laterality Date  . HYSTERECTOMY ABDOMINAL N/A 10/19/2011   Performed by Eldred Manges, MD at [facility]  . [place]   arthroscopy-right  . TUBAL LIGATION    . WISDOM TOOTH EXTRACTION      OB History    Gravida Para Term Preterm AB Living   2 2 1 1   2    SAB TAB Ectopic Multiple Live Births           2       Home Medications    Prior to Admission medications   Medication Sig Start Date End Date Taking? Authorizing Provider  cyclobenzaprine (FLEXERIL) 10 MG tablet Take 1 tablet (10 mg total) by mouth 2 (two) times daily as needed for muscle spasms. 10/31/14   Malvin Johns, MD  ibuprofen (ADVIL,MOTRIN) 600 MG tablet Take 1 tablet (600 mg total) by mouth  every 6 (six) hours as needed for moderate pain. 12/21/15   Domenic Moras, PA-C  predniSONE (DELTASONE) 20 MG tablet 3 tabs po day one, then 2 po daily x 4 days 12/19/16   Malvin Johns, MD  traMADol (ULTRAM) 50 MG tablet Take 1 tablet (50 mg total) every 6 (six) hours as needed by mouth. 12/19/16   Malvin Johns, MD    Family History Family History  Problem Relation Age of Onset  . Cancer Paternal Grandmother   . Heart disease Maternal Grandmother   . Hypertension Maternal Grandmother   . Hypertension Mother   . Asthma Son   . Thyroid disease Maternal Aunt   . Thyroid disease Maternal Aunt   . Cancer Paternal Aunt        Lung    Social History Social History   Tobacco Use  . Smoking status: Never Smoker  . Smokeless tobacco: Never Used  Substance Use Topics  . Alcohol use: Yes    Comment: occasional  . Drug use: No     Allergies   Sulfa antibiotics   Review of Systems Review of Systems  Constitutional: Negative for fever.  Gastrointestinal: Negative for  nausea and vomiting.  Musculoskeletal: Positive for arthralgias and joint swelling. Negative for back pain and neck pain.  Skin: Negative for wound.  Neurological: Negative for weakness, numbness and headaches.     Physical Exam Updated Vital Signs BP (!) 172/96 (BP Location: Left Arm)   Pulse 74   Temp 98.2 F (36.8 C) (Oral)   Resp 18   Ht 5' 6"  (1.676 m)   Wt 97.5 kg (215 lb)   LMP 10/19/2011   SpO2 99%   BMI 34.70 kg/m   Physical Exam  Constitutional: She is oriented to person, place, and time. She appears well-developed and well-nourished.  HENT:  Head: Normocephalic and atraumatic.  Neck: Normal range of motion. Neck supple.  Cardiovascular: Normal rate.  Pulmonary/Chest: Effort normal.  Musculoskeletal: She exhibits edema and tenderness.  Patient has some mild swelling to the dorsum of the left foot.  There is some tenderness over the lateral aspect of the foot.  There is no pain to the ankle  or the lower leg.  No swelling to the ankle or lower leg.  She has no erythema.  She has normal sensation and motor function the foot.  Pedal pulses are intact.  No wounds are noted.  Neurological: She is alert and oriented to person, place, and time.  Skin: Skin is warm and dry.  Psychiatric: She has a normal mood and affect.     ED Treatments / Results  Labs (all labs ordered are listed, but only abnormal results are displayed) Labs Reviewed - No data to display  EKG  EKG Interpretation None       Radiology Dg Foot Complete Left  Result Date: 12/19/2016 CLINICAL DATA:  Patient slipped on steps 2 weeks ago. Pain along the dorsum of the foot. EXAM: LEFT FOOT - COMPLETE 3+ VIEW COMPARISON:  None. FINDINGS: There is no evidence of fracture or dislocation. There is soft tissue swelling over the dorsum of the foot. Prominent calcaneal enthesophytes are seen along the plantar and dorsal aspect. The posterior facet of the subtalar joint is not well visualized and the possibility of a developmental subtalar coalition is not entirely excluded but findings are more likely due to projection given lack of chronic pain noted. Tiny accessory ossicle (os intermetatarseum) is suggested between the first and second metatarsals. IMPRESSION: 1. Mild soft tissue swelling over the dorsum of the foot. No acute fracture dislocations. 2. Calcaneal enthesopathy. Electronically Signed   By: Ashley Royalty M.D.   On: 12/19/2016 01:11    Procedures Procedures (including critical care time)  Medications Ordered in ED Medications - No data to display   Initial Impression / Assessment and Plan / ED Course  I have reviewed the triage vital signs and the nursing notes.  Pertinent labs & imaging results that were available during my care of the patient were reviewed by me and considered in my medical decision making (see chart for details).     Patient is a 46 year old female with 1.5-week history of left foot  pain and swelling.  I do not see any signs of infection.  She has a family history of gout but no personal history of gout.  It could be related to gout although it is less common location.  It also could be tendinitis related to the injury that she had just prior to the onset of symptoms.  I will put her on a short burst of steroids as well as tramadol.  I also gave her a postop shoe  to wear.  She has a follow-up appointment with a foot specialist on November 27.  Return precautions were given.  Final Clinical Impressions(s) / ED Diagnoses   Final diagnoses:  Sprain of left foot, initial encounter    ED Discharge Orders        Ordered    predniSONE (DELTASONE) 20 MG tablet     12/19/16 0237    traMADol (ULTRAM) 50 MG tablet  Every 6 hours PRN     12/19/16 0237       Malvin Johns, MD 12/19/16 [phone]

## 2016-12-19 NOTE — ED Notes (Signed)
Pt c/o left foot pain x 1 week. Hx injury while walking down steps. Moves toes. Feels touch. Cap refill < 3 sec. +dpp palp

## 2017-03-11 ENCOUNTER — Other Ambulatory Visit: Payer: Self-pay

## 2017-03-11 ENCOUNTER — Emergency Department (HOSPITAL_BASED_OUTPATIENT_CLINIC_OR_DEPARTMENT_OTHER): Payer: BLUE CROSS/BLUE SHIELD

## 2017-03-11 ENCOUNTER — Emergency Department (HOSPITAL_BASED_OUTPATIENT_CLINIC_OR_DEPARTMENT_OTHER)
Admission: EM | Admit: 2017-03-11 | Discharge: 2017-03-11 | Disposition: A | Discharge status: Home / Self Care | Payer: BLUE CROSS/BLUE SHIELD | Attending: Emergency Medicine | Admitting: Emergency Medicine

## 2017-03-11 ENCOUNTER — Encounter (HOSPITAL_BASED_OUTPATIENT_CLINIC_OR_DEPARTMENT_OTHER): Payer: Self-pay | Admitting: Emergency Medicine

## 2017-03-11 DIAGNOSIS — J111 Influenza due to unidentified influenza virus with other respiratory manifestations: Secondary | ICD-10-CM | POA: Insufficient documentation

## 2017-03-11 DIAGNOSIS — R509 Fever, unspecified: Secondary | ICD-10-CM | POA: Diagnosis present

## 2017-03-11 DIAGNOSIS — Z79899 Other long term (current) drug therapy: Secondary | ICD-10-CM | POA: Diagnosis not present

## 2017-03-11 DIAGNOSIS — R6889 Other general symptoms and signs: Secondary | ICD-10-CM

## 2017-03-11 MED ORDER — ACETAMINOPHEN 325 MG PO TABS
650.0000 mg | ORAL_TABLET | Freq: Once | ORAL | Status: AC
  Administered 2017-03-11: 650 mg via ORAL
  Filled 2017-03-11: qty 2

## 2017-03-11 NOTE — ED Triage Notes (Signed)
Pt presents with c/o headache and cough since Wednesday night

## 2017-03-11 NOTE — ED Notes (Signed)
ED Provider at bedside. 

## 2017-03-11 NOTE — Discharge Instructions (Signed)
You may alternate Tylenol 1000 mg every 6 hours as needed for fever and pain and ibuprofen 800 mg every 8 hours as needed for fever and pain. Please rest and drink plenty of fluids. This is a viral illness causing your symptoms. You do not need antibiotics for a virus. You may use over-the-counter nasal saline spray and Afrin nasal saline spray as needed for nasal congestion. Please do not use Afrin for more than 3 days in a row. You may use Mucinex and Dextromethorphan as needed for cough.  You may use lozenges and Chloraseptic spray to help with sore throat.  Warm salt water gargles can also help with sore throat.  You may use over-the-counter Unisom (doxyalamine) or Benadryl to help with sleep.  Please note that some combination medicines such as DayQuil and NyQuil have multiple medications in them.  Please make sure you look at all labels to ensure that you are not taking too much of any one particular medication.  Symptoms from a virus may take 7-14 days to run its course.  We do not test for the flu from the emergency department as we do not have rapid flu swabs and it takes hours for this test to come back and it would not change our management. The flu is treated like any other virus with supportive measures as listed above. Tamiflu has to be taken within the first 48 hours of symptoms.  Tamiflu has many side effects including nausea, vomiting and diarrhea.  This medication has only been shown to shorten the course of the flu by 1 day.  It does not make you feel better or less contagious.

## 2017-03-11 NOTE — ED Provider Notes (Signed)
TIME SEEN: 6:42 AM  CHIEF COMPLAINT: Fever, cough, sore throat, runny nose, body aches  HPI: Patient is a 47 year old female with no significant past medical history who presents to the emergency department with 2 days of fevers, nonproductive cough, sore throat, runny nose, body aches.  No vomiting or diarrhea.  Her husband had similar symptoms last week.  She did not have an influenza vaccination this year.  Did have headache but this has resolved after her fever has come down.  No neck pain or neck stiffness.  ROS: See HPI Constitutional:o fever  Eyes: no drainage  ENT:  runny nose   Cardiovascular:  no chest pain  Resp: no SOB  GI: no vomiting GU: no dysuria Integumentary: no rash  Allergy: no hives  Musculoskeletal: no leg swelling  Neurological: no slurred speech ROS otherwise negative  PAST MEDICAL HISTORY/PAST SURGICAL HISTORY:  Past Medical History:  Diagnosis Date  . Abnormal Pap smear 1996   CRYOTHERAPY  . Anxiety    currently with surgery  . Arthritis    joint swelling right knee-occasionally  . Chlamydia infection   . H/O varicella     MEDICATIONS:  Prior to Admission medications   Medication Sig Start Date End Date Taking? Authorizing Provider  cyclobenzaprine (FLEXERIL) 10 MG tablet Take 1 tablet (10 mg total) by mouth 2 (two) times daily as needed for muscle spasms. 10/31/14   Malvin Johns, MD  ibuprofen (ADVIL,MOTRIN) 600 MG tablet Take 1 tablet (600 mg total) by mouth every 6 (six) hours as needed for moderate pain. 12/21/15   Domenic Moras, PA-C  predniSONE (DELTASONE) 20 MG tablet 3 tabs po day one, then 2 po daily x 4 days 12/19/16   Malvin Johns, MD  traMADol (ULTRAM) 50 MG tablet Take 1 tablet (50 mg total) every 6 (six) hours as needed by mouth. 12/19/16   Malvin Johns, MD    ALLERGIES:  Allergies  Allergen Reactions  . Sulfa Antibiotics Hives and Itching    SOCIAL HISTORY:  Social History   Tobacco Use  . Smoking status: Never Smoker  .  Smokeless tobacco: Never Used  Substance Use Topics  . Alcohol use: Yes    Comment: occasional    FAMILY HISTORY: Family History  Problem Relation Age of Onset  . Cancer Paternal Grandmother   . Heart disease Maternal Grandmother   . Hypertension Maternal Grandmother   . Hypertension Mother   . Asthma Son   . Thyroid disease Maternal Aunt   . Thyroid disease Maternal Aunt   . Cancer Paternal Aunt        Lung    EXAM: BP (!) 157/80   Pulse (!) 125   Temp (!) 101.6 F (38.7 C) (Oral)   Resp 20   LMP 10/19/2011   SpO2 100%  CONSTITUTIONAL: Alert and oriented and responds appropriately to questions. Well-appearing; well-nourished HEAD: Normocephalic EYES: Conjunctivae clear, pupils appear equal, EOMI ENT: normal nose; moist mucous membranes; No pharyngeal erythema or petechiae, no tonsillar hypertrophy or exudate, no uvular deviation, no unilateral swelling, no trismus or drooling, no muffled voice, normal phonation, no stridor, no dental caries present, no drainable dental abscess noted, no Ludwig's angina, tongue sits flat in the bottom of the mouth, no angioedema, no facial erythema or warmth, no facial swelling; no pain with movement of the neck. NECK: Supple, no meningismus, no nuchal rigidity, no LAD  CARD: Regular and tachycardic; S1 and S2 appreciated; no murmurs, no clicks, no rubs, no gallops RESP: Normal  chest excursion without splinting or tachypnea; breath sounds clear and equal bilaterally; no wheezes, no rhonchi, no rales, no hypoxia or respiratory distress, speaking full sentences ABD/GI: Normal bowel sounds; non-distended; soft, non-tender, no rebound, no guarding, no peritoneal signs, no hepatosplenomegaly BACK:  The back appears normal and is non-tender to palpation, there is no CVA tenderness EXT: Normal ROM in all joints; non-tender to palpation; no edema; normal capillary refill; no cyanosis, no calf tenderness or swelling    SKIN: Normal color for age and  race; warm; no rash NEURO: Moves all extremities equally PSYCH: The patient's mood and manner are appropriate. Grooming and personal hygiene are appropriate.  MEDICAL DECISION MAKING: Patient here with flulike symptoms.  Discussed with patient that we do not do routine testing of the flu from the emergency department and she is comfortable with this plan.  Discussed with her that it would not change my management.  I have offered her Tamiflu given she is within 48 hours of the onset of symptoms.  We have discussed risk and benefits of this medication and she has declined it.  Recommended alternating Tylenol and Motrin for fever and pain.  Discussed increased rest and fluid intake.  Discussed over-the-counter medications that can be taken for different symptoms.  Will provide her with a work note.  Chest x-ray clear.  No sign of pneumonia.  Doubt meningitis.  No sign of bacterial pharyngitis, deep space neck infection or PTA.  She is tachycardic but this is improving as her fever is coming down.  Does not appear dehydrated or septic on exam.  I feel she is safe to be discharged.  Patient and husband are comfortable with this plan.  At this time, I do not feel there is any life-threatening condition present. I have reviewed and discussed all results (EKG, imaging, lab, urine as appropriate) and exam findings with patient/family. I have reviewed nursing notes and appropriate previous records.  I feel the patient is safe to be discharged home without further emergent workup and can continue workup as an outpatient as needed. Discussed usual and customary return precautions. Patient/family verbalize understanding and are comfortable with this plan.  Outpatient follow-up has been provided if needed. All questions have been answered.      Almir Botts, Delice Bison, DO 03/11/17 [phone]

## 2017-06-22 ENCOUNTER — Other Ambulatory Visit: Payer: Self-pay

## 2017-06-22 ENCOUNTER — Emergency Department (HOSPITAL_BASED_OUTPATIENT_CLINIC_OR_DEPARTMENT_OTHER)
Admission: EM | Admit: 2017-06-22 | Discharge: 2017-06-22 | Disposition: A | Discharge status: Home / Self Care | Payer: BLUE CROSS/BLUE SHIELD | Attending: Emergency Medicine | Admitting: Emergency Medicine

## 2017-06-22 ENCOUNTER — Encounter (HOSPITAL_BASED_OUTPATIENT_CLINIC_OR_DEPARTMENT_OTHER): Payer: Self-pay

## 2017-06-22 DIAGNOSIS — I10 Essential (primary) hypertension: Secondary | ICD-10-CM | POA: Diagnosis present

## 2017-06-22 DIAGNOSIS — Z79899 Other long term (current) drug therapy: Secondary | ICD-10-CM | POA: Insufficient documentation

## 2017-06-22 DIAGNOSIS — R6883 Chills (without fever): Secondary | ICD-10-CM | POA: Insufficient documentation

## 2017-06-22 DIAGNOSIS — J029 Acute pharyngitis, unspecified: Secondary | ICD-10-CM | POA: Insufficient documentation

## 2017-06-22 NOTE — ED Notes (Signed)
Pt verbalizes understanding of dc instructions and denies any further needs at this time

## 2017-06-22 NOTE — ED Triage Notes (Signed)
Pt c/o hypertension today when she went to her PCP for a sore throat, pt denies h/a, denies SOB, denies chest pain, denies weakness or numbness

## 2017-06-22 NOTE — Discharge Instructions (Signed)
Start walking for exercise, try to cut out the salt you are eating.  Check your blood pressure 2-3 times a week and write it down so that when you follow-up with your doctor they can evaluate to see if you need a blood pressure medication.  Finish the course of antibiotics.

## 2017-06-22 NOTE — ED Provider Notes (Signed)
[facility] EMERGENCY DEPARTMENT Provider Note   CSN: [id number] Arrival date & time: 06/22/17  1918     History   Chief Complaint Chief Complaint  Patient presents with  . Hypertension    HPI Joan Riley is a 47 y.o. female.  Patient is a 47 year old female with no significant past medical history presenting today with hypertension.  Patient since Monday had had a sore throat and was not feeling well.  She was seen at urgent care yesterday and diagnosed with strep throat.  She was started on amoxicillin.  However while she was at urgent care she was noted to be hypertensive.  This worried her so she decided to check her blood pressure tonight at work and it was elevated which concerned her even more she decided to come in and have it checked.  She has never had a diagnosis of hypertension in the past.  Last time she is seen a doctor was a few years ago.  She denies headache, chest pain, shortness of breath, lower extremity edema.  Patient does not follow any dietary restrictions.  She does not use tobacco has no prior history of diabetes and does not drink heavily.  The history is provided by the patient.  Hypertension  This is a new problem. The current episode started yesterday. The problem occurs constantly. Associated symptoms comments: Sore throat and chills at home since Monday.. The symptoms are aggravated by swallowing.    Past Medical History:  Diagnosis Date  . Abnormal Pap smear 1996   CRYOTHERAPY  . Anxiety    currently with surgery  . Arthritis    joint swelling right knee-occasionally  . Chlamydia infection   . H/O varicella     Patient Active Problem List   Diagnosis Date Noted  . S/P abdominal hysterectomy 11/29/2011  . Fibroids 09/06/2011    Past Surgical History:  Procedure Laterality Date  . ABDOMINAL HYSTERECTOMY  10/19/2011   Procedure: HYSTERECTOMY ABDOMINAL;  Surgeon: Eldred Manges, MD;  Location: [facility];  Service: Gynecology;   Laterality: N/A;  total abdominal hysterectomy with Bilateral Salpingectomy  . [place]   arthroscopy-right  . TUBAL LIGATION    . WISDOM TOOTH EXTRACTION       OB History    Gravida  2   Para  2   Term  1   Preterm  1   AB      Living  2     SAB      TAB      Ectopic      Multiple      Live Births  2            Home Medications    Prior to Admission medications   Medication Sig Start Date End Date Taking? Authorizing Provider  cyclobenzaprine (FLEXERIL) 10 MG tablet Take 1 tablet (10 mg total) by mouth 2 (two) times daily as needed for muscle spasms. 10/31/14   Malvin Johns, MD  ibuprofen (ADVIL,MOTRIN) 600 MG tablet Take 1 tablet (600 mg total) by mouth every 6 (six) hours as needed for moderate pain. 12/21/15   Domenic Moras, PA-C  predniSONE (DELTASONE) 20 MG tablet 3 tabs po day one, then 2 po daily x 4 days 12/19/16   Malvin Johns, MD  traMADol (ULTRAM) 50 MG tablet Take 1 tablet (50 mg total) every 6 (six) hours as needed by mouth. 12/19/16   Malvin Johns, MD    Family History Family History  Problem Relation Age of Onset  . Cancer Paternal Grandmother   . Heart disease Maternal Grandmother   . Hypertension Maternal Grandmother   . Hypertension Mother   . Asthma Son   . Thyroid disease Maternal Aunt   . Thyroid disease Maternal Aunt   . Cancer Paternal Aunt        Lung    Social History Social History   Tobacco Use  . Smoking status: Never Smoker  . Smokeless tobacco: Never Used  Substance Use Topics  . Alcohol use: Yes    Comment: occasional  . Drug use: No     Allergies   Sulfa antibiotics   Review of Systems Review of Systems  All other systems reviewed and are negative.    Physical Exam Updated Vital Signs BP (!) 155/88   Pulse (!) 102   Temp 99.7 F (37.6 C) (Oral)   Resp 17   Ht 5' 6"  (1.676 m)   Wt 98.4 kg (217 lb)   LMP 10/19/2011   SpO2 99%   BMI 35.02 kg/m   Physical Exam  Constitutional:  She is oriented to person, place, and time. She appears well-developed and well-nourished. No distress.  HENT:  Head: Normocephalic and atraumatic.  Mouth/Throat: Oropharyngeal exudate and posterior oropharyngeal erythema present.  Eyes: Pupils are equal, round, and reactive to light. Conjunctivae and EOM are normal.  Neck: Normal range of motion. Neck supple.  Cardiovascular: Normal rate, regular rhythm and intact distal pulses.  No murmur heard. Pulmonary/Chest: Effort normal and breath sounds normal. No respiratory distress. She has no wheezes. She has no rales.  Abdominal: Soft. She exhibits no distension. There is no tenderness. There is no rebound and no guarding.  Musculoskeletal: Normal range of motion. She exhibits no edema or tenderness.  Lymphadenopathy:    She has cervical adenopathy.  Neurological: She is alert and oriented to person, place, and time.  Skin: Skin is warm and dry. No rash noted. No erythema.  Psychiatric: She has a normal mood and affect. Her behavior is normal.  Nursing note and vitals reviewed.    ED Treatments / Results  Labs (all labs ordered are listed, but only abnormal results are displayed) Labs Reviewed - No data to display  EKG None  Radiology No results found.  Procedures Procedures (including critical care time)  Medications Ordered in ED Medications - No data to display   Initial Impression / Assessment and Plan / ED Course  I have reviewed the triage vital signs and the nursing notes.  Pertinent labs & imaging results that were available during my care of the patient were reviewed by me and considered in my medical decision making (see chart for details).     She is presenting today with concern for hypertension.  Upon initial arrival here patient was 220/113.  However with sitting and recheck of blood pressure it improved to 155/88.  Patient is also currently suffering from strep throat.  She is never been told she is  hypertensive in the past but does have a strong family history.  Patient is currently on antibiotics but no other medications.  She denies any chest pain, shortness of breath, edema or any complaints of endorgan damage.  With repeat blood pressure only being 155/88 recommended finishing her treatment for strep throat, going on a low salt diet and starting to exercise.  Recommended checking blood pressure 2-3 times a week and keeping a diary.  Patient will follow-up with PCP for ongoing care.  Final Clinical Impressions(s) / ED Diagnoses   Final diagnoses:  Hypertension, unspecified type    ED Discharge Orders    None       Blanchie Dessert, MD 06/22/17 2159

## 2018-02-13 NOTE — Telephone Encounter (Signed)
Patient left a message stating that she received a call or a letter stating that she was approved for the injection.  I do not see a notation that it is okay to schedule other than the one from September.  CB#[phone].  Thank you.

## 2018-02-14 NOTE — Telephone Encounter (Signed)
Talked with patient and advised her that we would need to resubmit for VOB since she was last approved in 2018.  Patient stated that she understands.

## 2018-02-14 NOTE — Telephone Encounter (Signed)
Noted! Thank you

## 2018-02-14 NOTE — Telephone Encounter (Signed)
Is it okay to resubmit for gel injection right knee or patient needs an appointment to be seen? Last appointment was 2018.  Please advise.  Thank you.

## 2018-02-14 NOTE — Telephone Encounter (Signed)
Ok to [employer] for gel

## 2018-02-23 ENCOUNTER — Telehealth (INDEPENDENT_AMBULATORY_CARE_PROVIDER_SITE_OTHER): Payer: Self-pay

## 2018-02-23 NOTE — Telephone Encounter (Signed)
Submitted VOB for SynviscOne, right knee.

## 2018-03-03 ENCOUNTER — Telehealth (INDEPENDENT_AMBULATORY_CARE_PROVIDER_SITE_OTHER): Payer: Self-pay

## 2018-03-03 NOTE — Telephone Encounter (Signed)
Talked with patient and advised her that Gilda Crease is no longer covered through [facility] and will submit for one of there preferred products which are Durolane, Euflexxa, and Gelsyn-3.  Patient stated that she understands.  Submitted VOB for Durolane, right knee.

## 2018-03-06 ENCOUNTER — Telehealth (INDEPENDENT_AMBULATORY_CARE_PROVIDER_SITE_OTHER): Payer: Self-pay

## 2018-03-06 NOTE — Telephone Encounter (Signed)
Tried calling patient to schedule appointment and advise her that she is approved for gel injection, but no answer and phone line was busy.  Will try again later.  Approved for Durolane, right knee. [place] [employer] deductible first ($600.00) Patient will be responsible for 20% OOP. No Co-pay No PA required

## 2018-03-20 ENCOUNTER — Encounter (INDEPENDENT_AMBULATORY_CARE_PROVIDER_SITE_OTHER): Payer: Self-pay | Admitting: Orthopedic Surgery

## 2018-03-20 ENCOUNTER — Ambulatory Visit (INDEPENDENT_AMBULATORY_CARE_PROVIDER_SITE_OTHER): Payer: BLUE CROSS/BLUE SHIELD | Admitting: Orthopedic Surgery

## 2018-03-20 DIAGNOSIS — M1711 Unilateral primary osteoarthritis, right knee: Secondary | ICD-10-CM

## 2018-03-25 ENCOUNTER — Encounter (INDEPENDENT_AMBULATORY_CARE_PROVIDER_SITE_OTHER): Payer: Self-pay | Admitting: Orthopedic Surgery

## 2018-03-25 DIAGNOSIS — M1711 Unilateral primary osteoarthritis, right knee: Secondary | ICD-10-CM

## 2018-03-25 MED ORDER — LIDOCAINE HCL 1 % IJ SOLN
5.0000 mL | INTRAMUSCULAR | Status: AC | PRN
  Administered 2018-03-25: 5 mL

## 2018-03-25 MED ORDER — SODIUM HYALURONATE 60 MG/3ML IX PRSY
60.0000 mg | PREFILLED_SYRINGE | INTRA_ARTICULAR | Status: AC | PRN
  Administered 2018-03-25: 60 mg via INTRA_ARTICULAR

## 2018-03-25 NOTE — Progress Notes (Signed)
   Procedure Note  Patient: Joan Riley             Date of Birth: 02-06-1970           MRN: [id number]             Visit Date: 03/20/2018  Procedures: Visit Diagnoses: Unilateral primary osteoarthritis, right knee  Large Joint Inj: R knee on 03/25/2018 8:10 AM Indications: diagnostic evaluation, joint swelling and pain Details: 18 G 1.5 in needle, superolateral approach  Arthrogram: No  Medications: 5 mL lidocaine 1 %; 60 mg Sodium Hyaluronate 60 MG/3ML Outcome: tolerated well, no immediate complications Procedure, treatment alternatives, risks and benefits explained, specific risks discussed. Consent was given by the patient. Immediately prior to procedure a time out was called to verify the correct patient, procedure, equipment, support staff and site/side marked as required. Patient was prepped and draped in the usual sterile fashion.

## 2018-08-09 ENCOUNTER — Emergency Department (HOSPITAL_BASED_OUTPATIENT_CLINIC_OR_DEPARTMENT_OTHER)
Admission: EM | Admit: 2018-08-09 | Discharge: 2018-08-09 | Disposition: A | Discharge status: Home / Self Care | Payer: BC Managed Care – PPO | Attending: Emergency Medicine | Admitting: Emergency Medicine

## 2018-08-09 ENCOUNTER — Other Ambulatory Visit: Payer: Self-pay

## 2018-08-09 ENCOUNTER — Encounter (HOSPITAL_BASED_OUTPATIENT_CLINIC_OR_DEPARTMENT_OTHER): Payer: Self-pay

## 2018-08-09 ENCOUNTER — Emergency Department (HOSPITAL_BASED_OUTPATIENT_CLINIC_OR_DEPARTMENT_OTHER): Payer: BC Managed Care – PPO

## 2018-08-09 DIAGNOSIS — Z79899 Other long term (current) drug therapy: Secondary | ICD-10-CM | POA: Diagnosis not present

## 2018-08-09 DIAGNOSIS — M7989 Other specified soft tissue disorders: Secondary | ICD-10-CM

## 2018-08-09 DIAGNOSIS — R2242 Localized swelling, mass and lump, left lower limb: Secondary | ICD-10-CM | POA: Diagnosis present

## 2018-08-09 NOTE — Discharge Instructions (Addendum)
DVT study negative.

## 2018-08-09 NOTE — ED Triage Notes (Signed)
Pt c/o swelling to left LE x today-denies injury-NAD-steady gait

## 2018-08-09 NOTE — ED Provider Notes (Signed)
[facility] EMERGENCY DEPARTMENT Provider Note   CSN: [id number] Arrival date & time: 08/09/18  1859    History   Chief Complaint Chief Complaint  Patient presents with  . Leg Swelling    HPI Joan Riley is a 48 y.o. female.     Here for DVT study.  The history is provided by the patient.  Leg Pain Location:  Leg Injury: no   Leg location:  L lower leg Pain details:    Quality:  Aching   Radiates to:  Does not radiate   Timing:  Constant Chronicity:  New Worsened by:  Nothing Ineffective treatments:  None tried Associated symptoms: swelling   Associated symptoms: no back pain and no fever     Past Medical History:  Diagnosis Date  . Abnormal Pap smear 1996   CRYOTHERAPY  . Anxiety    currently with surgery  . Arthritis    joint swelling right knee-occasionally  . Chlamydia infection   . H/O varicella     Patient Active Problem List   Diagnosis Date Noted  . S/P abdominal hysterectomy 11/29/2011  . Fibroids 09/06/2011    Past Surgical History:  Procedure Laterality Date  . ABDOMINAL HYSTERECTOMY  10/19/2011   Procedure: HYSTERECTOMY ABDOMINAL;  Surgeon: Eldred Manges, MD;  Location: [facility];  Service: Gynecology;  Laterality: N/A;  total abdominal hysterectomy with Bilateral Salpingectomy  . [place]   arthroscopy-right  . TUBAL LIGATION    . WISDOM TOOTH EXTRACTION       OB History    Gravida  2   Para  2   Term  1   Preterm  1   AB      Living  2     SAB      TAB      Ectopic      Multiple      Live Births  2            Home Medications    Prior to Admission medications   Medication Sig Start Date End Date Taking? Authorizing Provider  cyclobenzaprine (FLEXERIL) 10 MG tablet Take 1 tablet (10 mg total) by mouth 2 (two) times daily as needed for muscle spasms. 10/31/14   Malvin Johns, MD  ibuprofen (ADVIL,MOTRIN) 600 MG tablet Take 1 tablet (600 mg total) by mouth every 6 (six) hours as  needed for moderate pain. 12/21/15   Domenic Moras, PA-C  predniSONE (DELTASONE) 20 MG tablet 3 tabs po day one, then 2 po daily x 4 days 12/19/16   Malvin Johns, MD  traMADol (ULTRAM) 50 MG tablet Take 1 tablet (50 mg total) every 6 (six) hours as needed by mouth. 12/19/16   Malvin Johns, MD    Family History Family History  Problem Relation Age of Onset  . Cancer Paternal Grandmother   . Heart disease Maternal Grandmother   . Hypertension Maternal Grandmother   . Hypertension Mother   . Asthma Son   . Thyroid disease Maternal Aunt   . Thyroid disease Maternal Aunt   . Cancer Paternal Aunt        Lung    Social History Social History   Tobacco Use  . Smoking status: Never Smoker  . Smokeless tobacco: Never Used  Substance Use Topics  . Alcohol use: Yes    Comment: occasional  . Drug use: No     Allergies   Sulfa antibiotics   Review of Systems Review of Systems  Constitutional: Negative for chills and fever.  HENT: Negative for ear pain and sore throat.   Eyes: Negative for pain and visual disturbance.  Respiratory: Negative for cough and shortness of breath.   Cardiovascular: Positive for leg swelling. Negative for chest pain and palpitations.  Gastrointestinal: Negative for abdominal pain and vomiting.  Genitourinary: Negative for dysuria and hematuria.  Musculoskeletal: Negative for arthralgias and back pain.  Skin: Negative for color change and rash.  Neurological: Negative for seizures and syncope.  All other systems reviewed and are negative.    Physical Exam Updated Vital Signs  ED Triage Vitals  Enc Vitals Group     BP 08/09/18 1911 (!) 165/90     Pulse Rate 08/09/18 1911 97     Resp 08/09/18 1911 20     Temp 08/09/18 1911 99.5 F (37.5 C)     Temp Source 08/09/18 1911 Oral     SpO2 08/09/18 1911 100 %     Weight 08/09/18 1910 225 lb (102.1 kg)     Height 08/09/18 1910 5' 6"  (1.676 m)     Head Circumference --      Peak Flow --      Pain  Score 08/09/18 1909 0     Pain Loc --      Pain Edu? --      Excl. in [place]? --     Physical Exam Vitals signs and nursing note reviewed.  Constitutional:      General: She is not in acute distress.    Appearance: She is well-developed.  HENT:     Head: Normocephalic and atraumatic.  Eyes:     Conjunctiva/sclera: Conjunctivae normal.  Neck:     Musculoskeletal: Neck supple.  Cardiovascular:     Rate and Rhythm: Normal rate and regular rhythm.     Pulses: Normal pulses.     Heart sounds: Normal heart sounds. No murmur.  Pulmonary:     Effort: Pulmonary effort is normal. No respiratory distress.     Breath sounds: Normal breath sounds.  Abdominal:     Palpations: Abdomen is soft.     Tenderness: There is no abdominal tenderness.  Musculoskeletal: Normal range of motion.        General: No tenderness.     Right lower leg: No edema.     Left lower leg: Edema (trace) present.  Skin:    General: Skin is warm and dry.  Neurological:     Mental Status: She is alert.      ED Treatments / Results  Labs (all labs ordered are listed, but only abnormal results are displayed) Labs Reviewed - No data to display  EKG None  Radiology US Venous Img Lower  Left (dvt Study)  Result Date: 08/09/2018 CLINICAL DATA:  Left lower extremity edema for 1 day. EXAM: LEFT LOWER EXTREMITY VENOUS DOPPLER ULTRASOUND TECHNIQUE: Gray-scale sonography with graded compression, as well as color Doppler and duplex ultrasound were performed to evaluate the lower extremity deep venous systems from the level of the common femoral vein and including the common femoral, femoral, profunda femoral, popliteal and calf veins including the posterior tibial, peroneal and gastrocnemius veins when visible. The superficial great saphenous vein was also interrogated. Spectral Doppler was utilized to evaluate flow at rest and with distal augmentation maneuvers in the common femoral, femoral and popliteal veins. COMPARISON:   None. FINDINGS: Contralateral Common Femoral Vein: Respiratory phasicity is normal and symmetric with the symptomatic side. No evidence of thrombus. Normal compressibility.  Common Femoral Vein: No evidence of thrombus. Normal compressibility, respiratory phasicity and response to augmentation. Saphenofemoral Junction: No evidence of thrombus. Normal compressibility and flow on color Doppler imaging. Profunda Femoral Vein: No evidence of thrombus. Normal compressibility and flow on color Doppler imaging. Femoral Vein: No evidence of thrombus. Normal compressibility, respiratory phasicity and response to augmentation. Popliteal Vein: No evidence of thrombus. Normal compressibility, respiratory phasicity and response to augmentation. Calf Veins: No evidence of thrombus. Normal compressibility and flow on color Doppler imaging. Superficial Great Saphenous Vein: No evidence of thrombus. Normal compressibility. Venous Reflux:  None. Other Findings:  None. IMPRESSION: No evidence of left lower extremity deep venous thrombosis. Electronically Signed   By: Keith Rake M.D.   On: 08/09/2018 21:20    Procedures Procedures (including critical care time)  Medications Ordered in ED Medications - No data to display   Initial Impression / Assessment and Plan / ED Course  I have reviewed the triage vital signs and the nursing notes.  Pertinent labs & imaging results that were available during my care of the patient were reviewed by me and considered in my medical decision making (see chart for details).        BLOSSIE RAFFEL is a 48 year old female who presents to the ED for DVT study.  Patient with normal vitals.  No fever.  Patient with very mild left lower leg swelling.  No pain.  Called telemedicine who recommended that she get a DVT study.  Patient has good pulses.  Doubt arterial process.  No trauma history.  DVT study was negative.  Overall likely venous stasis.  Discharged from ED in good  condition.  Given return precautions.  This chart was dictated using voice recognition software.  Despite best efforts to proofread,  errors can occur which can change the documentation meaning.    Final Clinical Impressions(s) / ED Diagnoses   Final diagnoses:  Leg swelling    ED Discharge Orders    None       Lennice Sites, DO 08/09/18 2129

## 2018-10-27 ENCOUNTER — Ambulatory Visit: Payer: BC Managed Care – PPO | Admitting: Orthopedic Surgery

## 2018-11-06 ENCOUNTER — Ambulatory Visit (INDEPENDENT_AMBULATORY_CARE_PROVIDER_SITE_OTHER): Payer: BC Managed Care – PPO

## 2018-11-06 ENCOUNTER — Encounter: Payer: Self-pay | Admitting: Orthopedic Surgery

## 2018-11-06 ENCOUNTER — Other Ambulatory Visit: Payer: Self-pay

## 2018-11-06 ENCOUNTER — Ambulatory Visit (INDEPENDENT_AMBULATORY_CARE_PROVIDER_SITE_OTHER): Payer: BC Managed Care – PPO | Admitting: Orthopedic Surgery

## 2018-11-06 VITALS — Ht 66.0 in | Wt 210.0 lb

## 2018-11-06 DIAGNOSIS — G8929 Other chronic pain: Secondary | ICD-10-CM

## 2018-11-06 DIAGNOSIS — M25561 Pain in right knee: Secondary | ICD-10-CM

## 2018-11-08 ENCOUNTER — Encounter: Payer: Self-pay | Admitting: Orthopedic Surgery

## 2018-11-08 NOTE — Progress Notes (Signed)
Office Visit Note   Patient: Joan Riley           Date of Birth: May 04, 1970           MRN: [id number] Visit Date: 11/06/2018 Requested by: Verdell Carmine., MD [place] [place],  [place] [address] PCP: Verdell Carmine., MD  Subjective: Chief Complaint  Patient presents with  . Right Knee - Pain    HPI: Joan Riley is a patient with right knee pain.  She had right knee Durolane injection 03/06/2018 and did get relief from the injection but has had increasing pain since that is been wearing off.  She does describe swelling in the knee and she uses ice for that.  Has sharp shooting pain in the anterior aspect of the knee.  No discrete weakness.  Takes Tylenol with some relief.  She walks on concrete at work.  Does have history of arthroscopy on that right knee when she was in high school.  She does work at [employer] as a Scientist, clinical (histocompatibility and immunogenetics) but she has to walk on concrete.              ROS: All systems reviewed are negative as they relate to the chief complaint within the history of present illness.  Patient denies  fevers or chills.   Assessment & Plan: Visit Diagnoses:  1. Chronic pain of right knee     Plan: Impression is right knee pain with fairly normal-appearing radiographs today.  She has had some relief from Durolane injection in the past and likely has a component of arthritis present in the knee.  Plan is MRI scan right knee to evaluate that medial compartment to essentially asked and answer the question is there anything treatable in that knee arthroscopically.  If not then we will have to wait this out but if so there could be something that is treatable that we could help her out with in terms of pain relief.  Follow-Up Instructions: Return for after MRI.   Orders:  Orders Placed This Encounter  Procedures  . XR KNEE 3 VIEW RIGHT  . MR Knee Right w/o contrast   No orders of the defined types were placed in this encounter.     Procedures: No procedures  performed   Clinical Data: No additional findings.  Objective: Vital Signs: Ht 5' 6"  (1.676 m)   Wt 210 lb (95.3 kg)   LMP 10/19/2011   BMI 33.89 kg/m   Physical Exam:   Constitutional: Patient appears well-developed HEENT:  Head: Normocephalic Eyes:EOM are normal Neck: Normal range of motion Cardiovascular: Normal rate Pulmonary/chest: Effort normal Neurologic: Patient is alert Skin: Skin is warm Psychiatric: Patient has normal mood and affect    Ortho Exam: Ortho exam demonstrates normal gait alignment.  Patient has full range of motion right knee with no effusion.  Has primarily anterior type pain but not much in the way of patellofemoral crepitus bilaterally.  Collateral crucial ligaments are stable.  Extensor mechanism is intact.  No masses lymphadenopathy or skin changes noted in that right knee region  Specialty Comments:  No specialty comments available.  Imaging: No results found.   PMFS History: Patient Active Problem List   Diagnosis Date Noted  . S/P abdominal hysterectomy 11/29/2011  . Fibroids 09/06/2011   Past Medical History:  Diagnosis Date  . Abnormal Pap smear 1996   CRYOTHERAPY  . Anxiety    currently with surgery  . Arthritis    joint swelling right  knee-occasionally  . Chlamydia infection   . H/O varicella     Family History  Problem Relation Age of Onset  . Cancer Paternal Grandmother   . Heart disease Maternal Grandmother   . Hypertension Maternal Grandmother   . Hypertension Mother   . Asthma Son   . Thyroid disease Maternal Aunt   . Thyroid disease Maternal Aunt   . Cancer Paternal Aunt        Lung    Past Surgical History:  Procedure Laterality Date  . ABDOMINAL HYSTERECTOMY  10/19/2011   Procedure: HYSTERECTOMY ABDOMINAL;  Surgeon: Eldred Manges, MD;  Location: [facility];  Service: Gynecology;  Laterality: N/A;  total abdominal hysterectomy with Bilateral Salpingectomy  . [place]   arthroscopy-right  .  TUBAL LIGATION    . WISDOM TOOTH EXTRACTION     Social History   Occupational History  . Not on file  Tobacco Use  . Smoking status: Never Smoker  . Smokeless tobacco: Never Used  Substance and Sexual Activity  . Alcohol use: Yes    Comment: occasional  . Drug use: No  . Sexual activity: Yes    Birth control/protection: Surgical    Comment: BTL

## 2018-11-22 ENCOUNTER — Other Ambulatory Visit: Payer: BC Managed Care – PPO

## 2018-11-29 ENCOUNTER — Ambulatory Visit: Payer: BC Managed Care – PPO | Admitting: Orthopedic Surgery

## 2020-05-20 IMAGING — US VENOUS DOPPLER ULTRASOUND OF LEFT LOWER EXTREMITY
1 series · 13 of 24 positions shown · non-contrast
Comparison: None.

CLINICAL DATA: Left lower extremity edema for 1 day.



[Series 1: venous doppler ultrasound of left lower extremity · 13 of 31 slices shown]
[im 1/31]
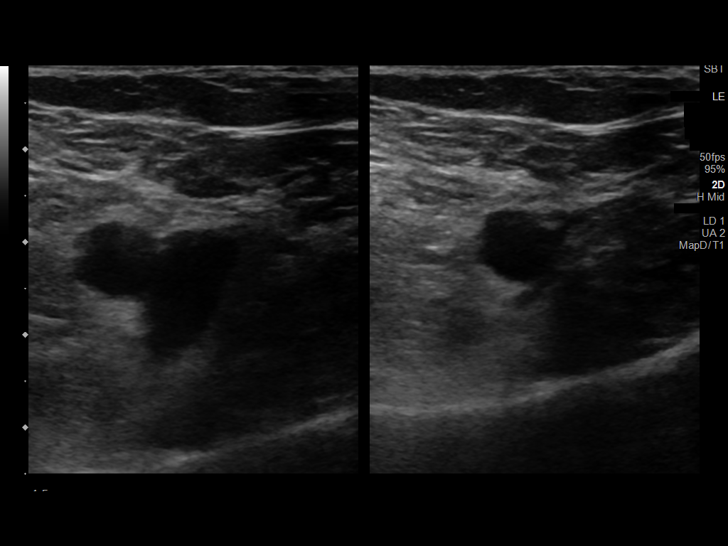
[im 3/31]
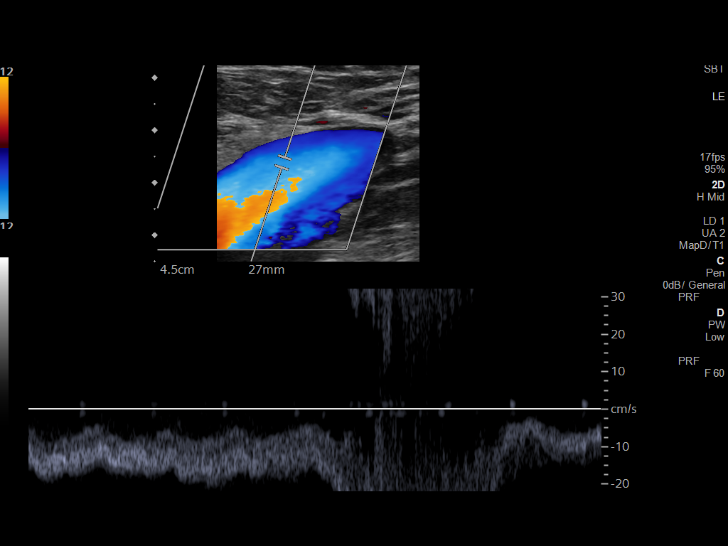
[im 6/31]
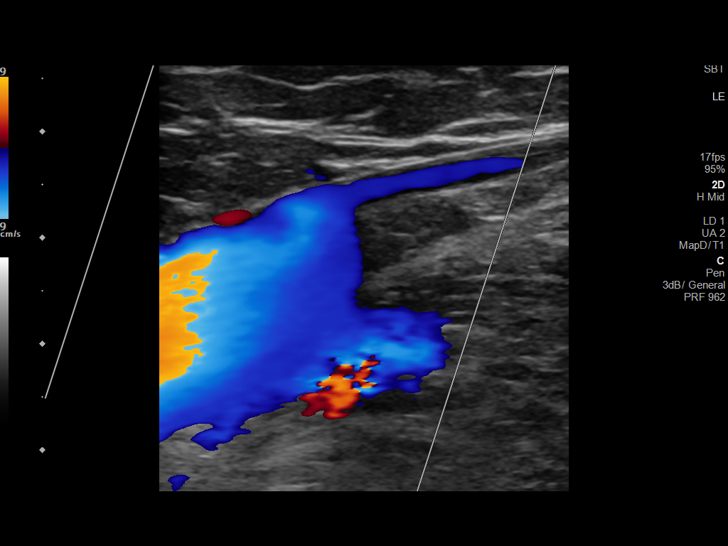
[im 8/31]
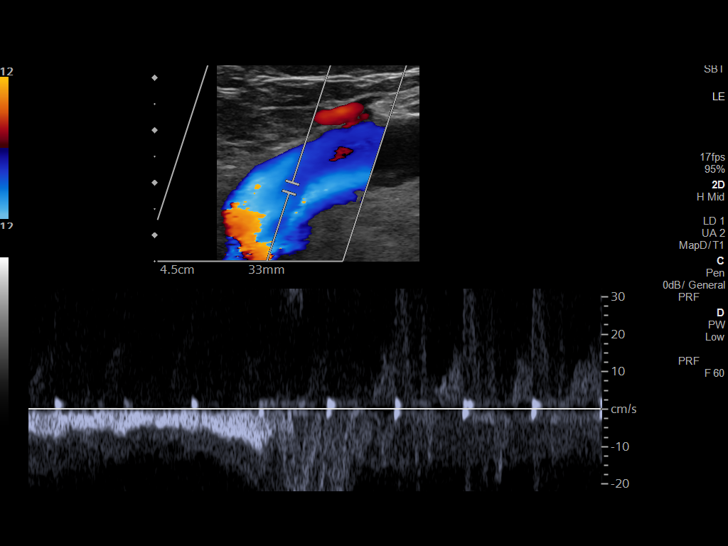
[im 11/31]
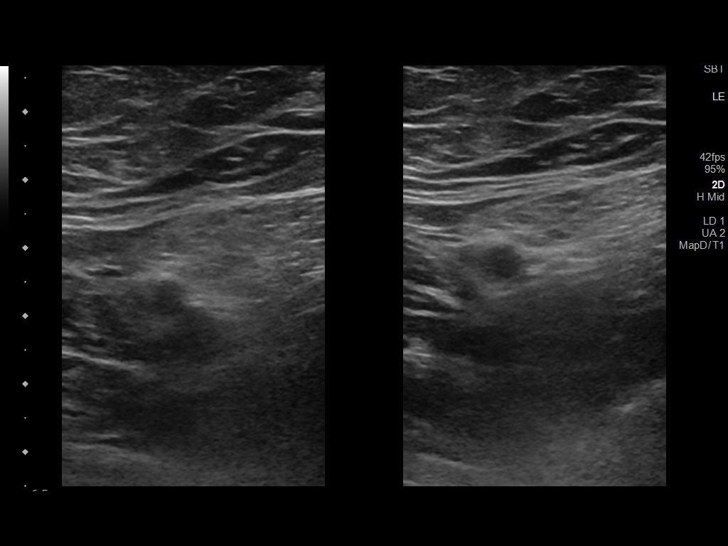
[im 14/31]
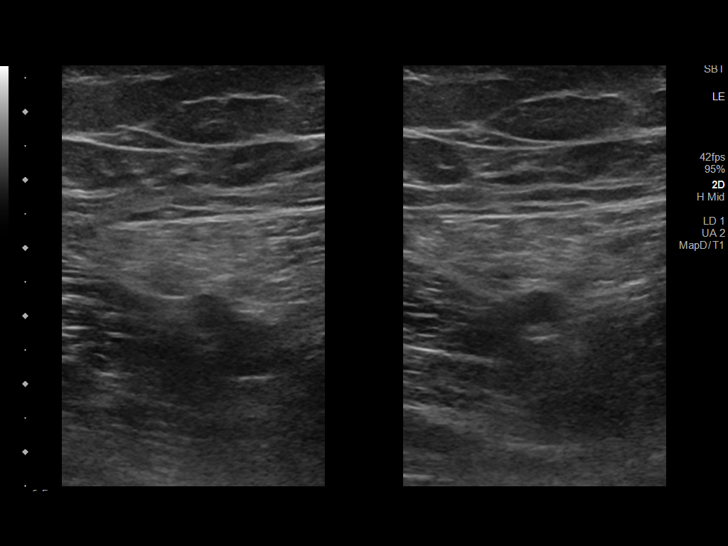
[im 16/31]
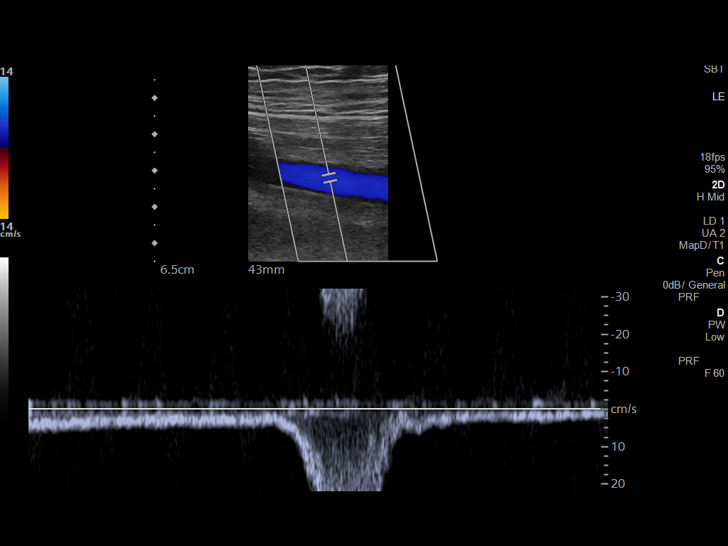
[im 17/31]
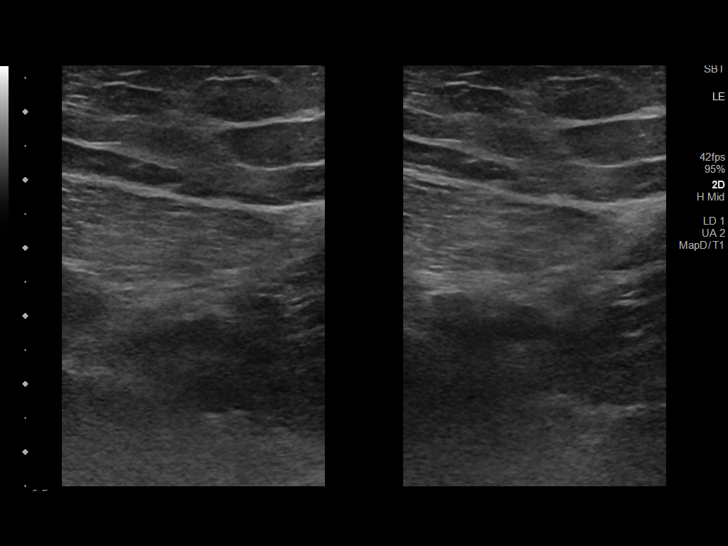
[im 20/31]
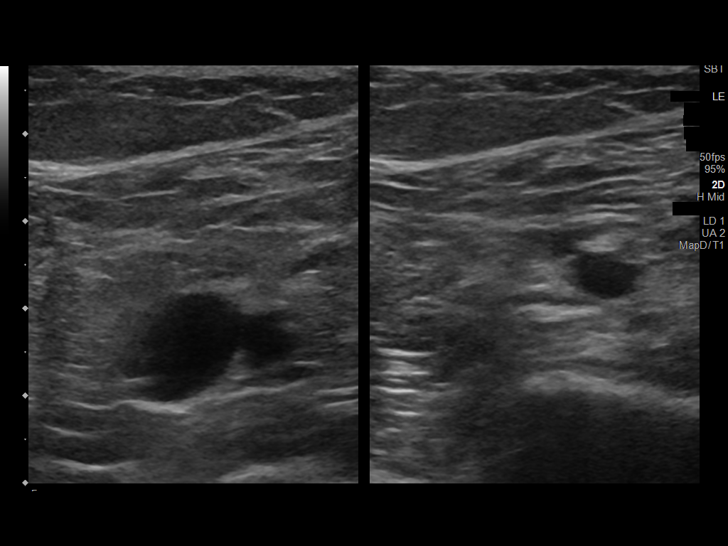
[im 23/31]
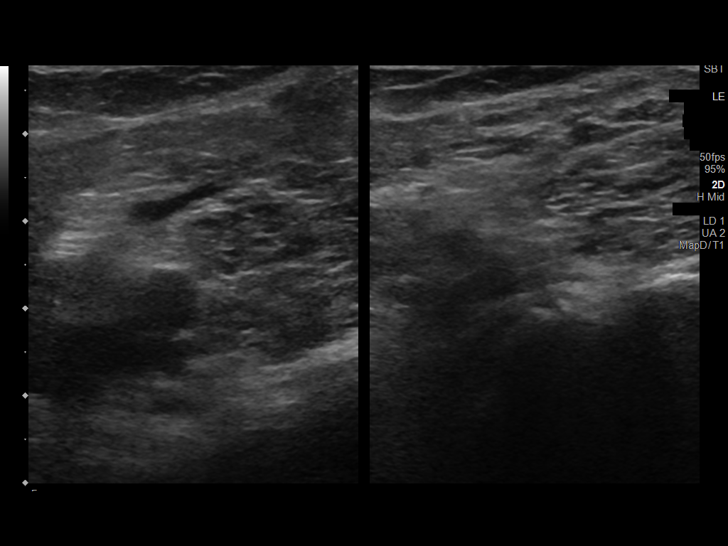
[im 25/31]
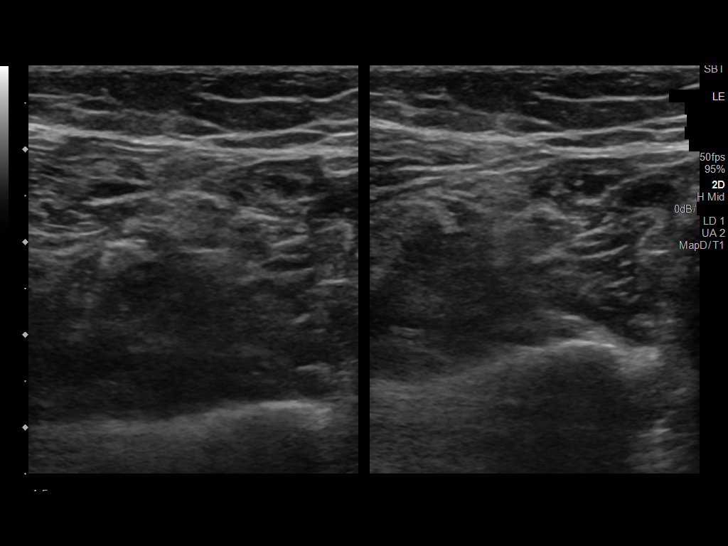
[im 28/31]
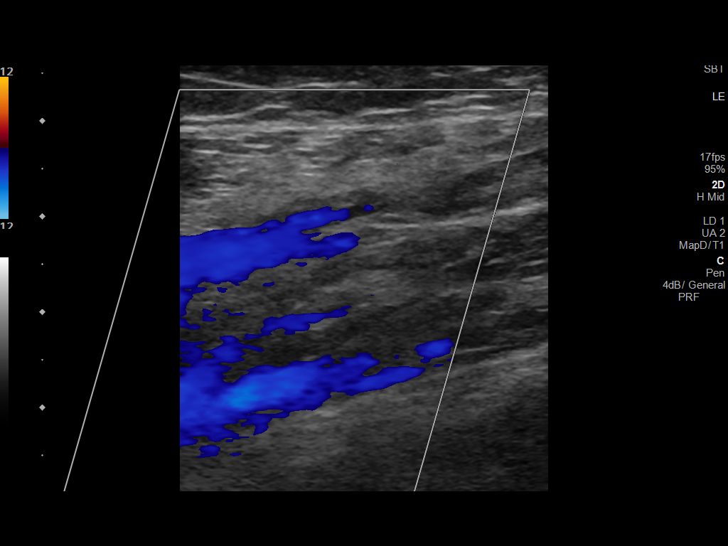
[im 31/31]
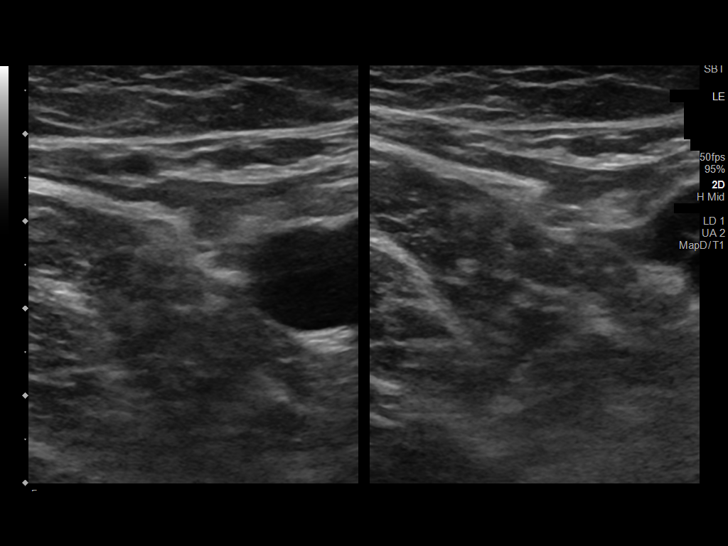

[13 of 24 positions shown; findings below may reference images not displayed]

FINDINGS: Contralateral Common Femoral Vein: Respiratory phasicity is normal
and symmetric with the symptomatic side. No evidence of thrombus.
Normal compressibility.

Common Femoral Vein: No evidence of thrombus. Normal
compressibility, respiratory phasicity and response to augmentation.

Saphenofemoral Junction: No evidence of thrombus. Normal
compressibility and flow on color Doppler imaging.

Profunda Femoral Vein: No evidence of thrombus. Normal
compressibility and flow on color Doppler imaging.

Femoral Vein: No evidence of thrombus. Normal compressibility,
respiratory phasicity and response to augmentation.

Popliteal Vein: No evidence of thrombus. Normal compressibility,
respiratory phasicity and response to augmentation.

Calf Veins: No evidence of thrombus. Normal compressibility and flow
on color Doppler imaging.

Superficial Great Saphenous Vein: No evidence of thrombus. Normal
compressibility.

Venous Reflux:  None.

Other Findings:  None.
IMPRESSION: No evidence of left lower extremity deep venous thrombosis.

## 2021-04-17 DIAGNOSIS — Z79899 Other long term (current) drug therapy: Secondary | ICD-10-CM | POA: Insufficient documentation

## 2021-04-17 DIAGNOSIS — R1319 Other dysphagia: Secondary | ICD-10-CM | POA: Insufficient documentation

## 2021-04-17 DIAGNOSIS — R1310 Dysphagia, unspecified: Secondary | ICD-10-CM | POA: Diagnosis present

## 2021-04-17 DIAGNOSIS — I10 Essential (primary) hypertension: Secondary | ICD-10-CM | POA: Diagnosis not present

## 2021-04-18 ENCOUNTER — Other Ambulatory Visit: Payer: Self-pay

## 2021-04-18 ENCOUNTER — Encounter (HOSPITAL_BASED_OUTPATIENT_CLINIC_OR_DEPARTMENT_OTHER): Payer: Self-pay | Admitting: Emergency Medicine

## 2021-04-18 ENCOUNTER — Emergency Department (HOSPITAL_BASED_OUTPATIENT_CLINIC_OR_DEPARTMENT_OTHER): Payer: BC Managed Care – PPO

## 2021-04-18 ENCOUNTER — Emergency Department (HOSPITAL_BASED_OUTPATIENT_CLINIC_OR_DEPARTMENT_OTHER)
Admission: EM | Admit: 2021-04-18 | Discharge: 2021-04-18 | Disposition: A | Discharge status: Home / Self Care | Payer: BC Managed Care – PPO | Attending: Emergency Medicine | Admitting: Emergency Medicine

## 2021-04-18 DIAGNOSIS — R1310 Dysphagia, unspecified: Secondary | ICD-10-CM

## 2021-04-18 NOTE — ED Notes (Signed)
Patient was able to tolerate swallowing liquids.  ?

## 2021-04-18 NOTE — ED Provider Notes (Signed)
?[facility] EMERGENC[facility] DEPARTMENT ?Provider Note ? ? ?CSN: [id number] ?Arrival date & time: 04/17/21  2346 ? ?  ? ?History ? ?Chief Complaint  ?Patient presents with  ? Dysphagia  ? ? ?Joan Riley is a 51 y.o. female. ? ?The history is provided by the patient.  ?Patient reports she is having difficulty swallowing.  This occurred just prior to arrival.  Patient reports she was in her usual state of health she took her evening medications before bedtime which includes her blood pressure medicines.  After lying down, she felt like she had difficulty swallowing with mild pain.  No drooling.  No chest pain or shortness of breath. ?She has never had this before.  It was not associated with eating.  She does not recall have any pain when eating chicken salad earlier for dinner ?  ? ?Home Medications ?Prior to Admission medications   ?Medication Sig Start Date End Date Taking? Authorizing Provider  ?escitalopram (LEXAPRO) 10 MG tablet  10/17/18   [provider]  ?hydrochlorothiazide (HYDRODIURIL) 25 MG tablet  10/17/18   [provider]  ?metoprolol succinate (TOPROL-XL) 50 MG 24 hr tablet  09/01/18   [provider]  ?   ? ?Allergies    ?Lisinopril and Sulfa antibiotics   ? ?Review of Systems   ?Review of Systems  ?Constitutional:  Negative for fever.  ?HENT:  Positive for sore throat and trouble swallowing. Negative for drooling and voice change.   ?Respiratory:  Negative for shortness of breath.   ?Cardiovascular:  Negative for chest pain.  ?Gastrointestinal:  Negative for abdominal pain, nausea and vomiting.  ?Neurological:  Negative for weakness.  ?All other systems reviewed and are negative. ? ?Physical Exam ?Updated Vital Signs ?BP (!) 176/86 (BP Location: Left Arm)   Pulse 88   Temp 98 ?F (36.7 ?C)   Resp 16   Ht 1.676 m (5' 6" )   Wt 93 kg   LMP 10/19/2011   SpO2 99%   BMI 33.09 kg/m?  ?Physical Exam ?CONSTITUTIONAL: Well developed/well nourished ?HEAD:  Normocephalic/atraumatic ?EYES: EOMI/PERRL ?ENMT: Mucous membranes moist, uvula midline without erythema or exudates.  No stridor, no drooling, no edema. ?NECK: supple no meningeal signs, no thyromegaly, no cervical lymphadenopathy ?SPINE/BACK:entire spine nontender ?CV: S1/S2 noted, no murmurs/rubs/gallops noted ?LUNGS: Lungs are clear to auscultation bilaterally, no apparent distress ?ABDOMEN: soft ?NEURO: Pt is awake/alert/appropriate, moves all extremitiesx4.  No facial droop.   ?EXTREMITIES: full ROM ?SKIN: warm, color normal ?PSYCH: no abnormalities of mood noted, alert and oriented to situation ? ?ED Results / Procedures / Treatments   ?Labs ?(all labs ordered are listed, but only abnormal results are displayed) ?Labs Reviewed - No data to display ? ?EKG ?None ? ?Radiology ?DG Neck Soft Tissue ? ?Result Date: 04/18/2021 ?CLINICAL DATA:  Odynophagia EXAM: NECK SOFT TISSUES - 1+ VIEW COMPARISON:  None. FINDINGS: There is no evidence of retropharyngeal soft tissue swelling or epiglottic enlargement. The cervical airway is unremarkable and no radio-opaque foreign body identified. IMPRESSION: Negative. Electronically Signed   By: Ulyses Jarred M.D.   On: 04/18/2021 02:11   ? ?Procedures ?Procedures  ? ? ?Medications Ordered in ED ?Medications - No data to display ? ?ED Course/ Medical Decision Making/ A&P ?Clinical Course as of 04/18/21 0217  ?Sat Apr 18, 2021  ?0215 X-ray was visualized and negative.  Patient no distress.  She has no other acute complaints.  She is asymptomatic hypertension.  Will refer to gastroenterology due to her difficulty  swallowing.  She was able to drink fluids here [DW]  ?  ?Clinical Course User Index ?[DW] Ripley Fraise, MD  ? ?                        ?Medical Decision Making ?Amount and/or Complexity of Data Reviewed ?Radiology: ordered. ? ? ?This patient presents to the ED for concern of a odynophasia, this involves an extensive number of treatment options, and is a complaint that  carries with it a high risk of complications and morbidity.  The differential diagnosis includes food obstruction, esophageal perforation, esophageal abrasion ? ?Comorbidities that complicate the patient evaluation: ?Patient?s presentation is complicated by their history of hypertension ? ? ? ?Imaging Studies ordered: ?I ordered imaging studies including X-ray soft tissue neck   ?I independently visualized and interpreted imaging which showed no acute findings ?I agree with the radiologist interpretation ? ? ? ?Test Considered: ?Considered CT imaging neck, with patient overall well-appearing and is handling her secretions ? ? ?Reevaluation: ?After the interventions noted above, I reevaluated the patient and found that they have :improved ? ?Complexity of problems addressed: ?Patient?s presentation is most consistent with  acute complicated illness/injury requiring diagnostic workup ? ?Disposition: ?After consideration of the diagnostic results and the patient?s response to treatment,  ?I feel that the patent would benefit from discharge   .  ? ? ? ? ? ? ? ? ? ?Final Clinical Impression(s) / ED Diagnoses ?Final diagnoses:  ?Odynophagia  ? ? ?Rx / DC Orders ?ED Discharge Orders   ? ? None  ? ?  ? ? ?  ?Ripley Fraise, MD ?04/18/21 [phone] ? ?

## 2021-04-18 NOTE — ED Triage Notes (Signed)
Patient arrived via POV c/o difficulty swallowing x 1hr. Patient states as she was going to bed tonight, she felt like it was difficult to swallow. Patient states it was not after eating earlier. Patient states able to swallow with difficulty. Patient is AO x 4, VS elevated BP, normal gait. ?

## 2021-04-18 NOTE — ED Notes (Signed)
To X-ray

## 2021-04-24 ENCOUNTER — Other Ambulatory Visit: Payer: Self-pay

## 2021-04-24 ENCOUNTER — Encounter: Payer: Self-pay | Admitting: Gastroenterology

## 2021-04-24 ENCOUNTER — Ambulatory Visit (INDEPENDENT_AMBULATORY_CARE_PROVIDER_SITE_OTHER): Payer: BC Managed Care – PPO | Admitting: Gastroenterology

## 2021-04-24 VITALS — BP 140/100 | HR 71 | Ht 66.0 in | Wt 207.0 lb

## 2021-04-24 DIAGNOSIS — Z1211 Encounter for screening for malignant neoplasm of colon: Secondary | ICD-10-CM

## 2021-04-24 DIAGNOSIS — R1310 Dysphagia, unspecified: Secondary | ICD-10-CM | POA: Diagnosis not present

## 2021-04-24 MED ORDER — NA SULFATE-K SULFATE-MG SULF 17.5-3.13-1.6 GM/177ML PO SOLN: 1.0000 | Freq: Once | ORAL | 0 refills | Status: AC

## 2021-04-24 NOTE — Progress Notes (Signed)
? ? ?          ?Chief Complaint: Odynophagia ? ? ?Referring Provider:     Gifford Shave, MD ? ? ?HPI:   ? ? ?Joan Riley is a 51 y.o. female referred to the Gastroenterology Clinic for evaluation of odynophagia. ? ?She was seen in the ER on 04/18/2021 with acute onset mild odynophagia.  Was tolerating secretions.  Was able to drink water in the ER.  No prior similar symptoms.  Actually tolerated chicken salad dinner earlier in the evening, then acute onset when she laid down to bed.  Described as a discomfort, pointing to her left side of her neck. ?- Soft tissue neck x-ray: Normal ? ?Sxs resolved by the following morning, and has not had any recurrence of symptoms.  Tolerating all p.o. intake without difficulty. ? ?No hx of GERD, HB, regurgitation. Wt stable. No lower GI sxs.  ? ?Fhx notable for mother with esophageal strictures requiring dilation.  ? ?No previous EGD or colonoscopy.  No recent labs for review. ? ?Reports a negative Cologuard at some point in the past. No results available for review.  ? ? ?Past Medical History:  ?Diagnosis Date  ? Abnormal Pap smear 1996  ? CRYOTHERAPY  ? Anxiety   ? currently with surgery  ? Arthritis   ? joint swelling right knee-occasionally  ? Chlamydia infection   ? H/O varicella   ? ? ? ?Past Surgical History:  ?Procedure Laterality Date  ? ABDOMINAL HYSTERECTOMY  10/19/2011  ? Procedure: HYSTERECTOMY ABDOMINAL;  Surgeon: Eldred Manges, MD;  Location: [facility];  Service: Gynecology;  Laterality: N/A;  total abdominal hysterectomy with Bilateral Salpingectomy  ? KNEE SURGERY  1991  ? arthroscopy-right  ? TUBAL LIGATION    ? WISDOM TOOTH EXTRACTION    ? ?Family History  ?Problem Relation Age of Onset  ? Cancer Paternal Grandmother   ? Heart disease Maternal Grandmother   ? Hypertension Maternal Grandmother   ? Hypertension Mother   ? Asthma Son   ? Thyroid disease Maternal Aunt   ? Thyroid disease Maternal Aunt   ? Cancer Paternal Aunt   ?     Lung  ? ?Social History   ? ?Tobacco Use  ? Smoking status: Never  ? Smokeless tobacco: Never  ?Vaping Use  ? Vaping Use: Never used  ?Substance Use Topics  ? Alcohol use: Yes  ?  Comment: occasional  ? Drug use: No  ? ?Current Outpatient Medications  ?Medication Sig Dispense Refill  ? metoprolol succinate (TOPROL-XL) 50 MG 24 hr tablet     ? ?No current facility-administered medications for this visit.  ? ?Allergies  ?Allergen Reactions  ? Lisinopril Swelling  ?  Tongue swelling ?Tongue swelling ?  ? Sulfa Antibiotics Hives and Itching  ? ? ? ?Review of Systems: ?All systems reviewed and negative except where noted in HPI.  ? ? ? ?Physical Exam:   ? ?Wt Readings from Last 3 Encounters:  ?04/24/21 207 lb (93.9 kg)  ?04/18/21 205 lb (93 kg)  ?11/06/18 210 lb (95.3 kg)  ? ? ?Ht 5' 6"  (1.676 m)   Wt 207 lb (93.9 kg)   LMP 10/19/2011   BMI 33.41 kg/m?  ?Constitutional:  Pleasant, in no acute distress. ?Psychiatric: Normal mood and affect. Behavior is normal. ?EENT: Pupils normal.  Conjunctivae are normal. No scleral icterus. ?Neck supple. No cervical LAD. ?Cardiovascular: Normal rate, regular rhythm. No edema ?Pulmonary/chest: Effort normal and breath sounds normal. No wheezing,  rales or rhonchi. ?Abdominal: Soft, nondistended, nontender. Bowel sounds active throughout. There are no masses palpable. No hepatomegaly. ?Neurological: Alert and oriented to person place and time. ?Skin: Skin is warm and dry. No rashes noted. ? ? ?ASSESSMENT AND PLAN;  ? ?1) Odynophagia ?Acute onset, mild odynophagia, which was self-limiting and resolved by the following morning.  Unclear etiology, but favor some mild proximal esophageal irritation, potentially from taking routine medication prior to bedtime (?  Pill esophagitis).  Nonetheless, symptoms have completely resolved without recurrence.  No prior similar symptoms. ?- Conservative management appropriate at this juncture ?- If symptoms should recur, to call the office and can discuss whether or not EGD is  appropriate, but otherwise do not think EGD needed at this time ? ?2) Colon cancer screening ?- Due for age-appropriate, average risk CRC screening ?- Schedule colonoscopy ? ?The indications, risks, and benefits of colonoscopy were explained to the patient in detail. Risks include but are not limited to bleeding, perforation, adverse reaction to medications, and cardiopulmonary compromise. Sequelae include but are not limited to the possibility of surgery, hospitalization, and mortality. The patient verbalized understanding and wished to proceed. All questions answered, referred to the scheduler and bowel prep ordered. Further recommendations pending results of the exam.  ? ? ?[place], DO, FACG  04/24/2021, 10:30 AM ? ? ?Gifford Shave, MD ? ?

## 2021-04-24 NOTE — Patient Instructions (Addendum)
If you are age 50 or older, your body mass index should be between 23-30. Your Body mass index is 33.41 kg/m?Marland Kitchen If this is out of the aforementioned range listed, please consider follow up with your Primary Care Provider. ? ?If you are age 71 or younger, your body mass index should be between 19-25. Your Body mass index is 33.41 kg/m?Marland Kitchen If this is out of the aformentioned range listed, please consider follow up with your Primary Care Provider.  ? ?________________________________________________________ ? ?The [facility] providers would like to encourage you to use [facility] to communicate with providers for non-urgent requests or questions.  Due to long hold times on the telephone, sending your provider a message by [facility] may be a faster and more efficient way to get a response.  Please allow 48 business hours for a response.  Please remember that this is for non-urgent requests.  ?_______________________________________________________ ? ?You have been scheduled for a colonoscopy. Please follow written instructions given to you at your visit today.  ?Please pick up your prep supplies at the pharmacy within the next 1-3 days. ?If you use inhalers (even only as needed), please bring them with you on the day of your procedure. ? ?We have sent the following medications to your pharmacy for you to pick up at your convenience: ?[place] ? ?Follow up as needed. ? ?It was a pleasure to see you today! ? ?Gerrit Heck, D.O. ? ? ? ?We want to thank you for trusting [facility] with your care. All of our staff and providers value the relationships we have built with our patients, and it is an honor to care for you.  ? ?We are writing to let you know that [facility] will close on Jun 15, 2021, and we invite you to continue to see Dr. Carmell Austria and Gerrit Heck at the [facility] [place] office location. We are consolidating our services at these [facility] practices  to better provide care. Our office staff will work with you to ensure a seamless transition.  ? ?Gerrit Heck, DO -Dr. Bryan Lemma will be moving to [facility] at [address], [place], [place] [address], effective Jun 15, 2021.  Contact [phone] to schedule an appointment with him.  ? ?Carmell Austria, MD- Dr. Lyndel Safe will be moving to [facility] at [address], [place], [place] [address], effective Jun 15, 2021.  Contact [phone] to schedule an appointment with him.  ? ?Requesting Medical Records ?If you need to request your medical records, please follow the instructions below. Your medical records are confidential, and a copy can be transferred to another provider or released to you or another person you designate only with your permission. ? ?There are several ways to request your medical records: ?Requests for medical records can be submitted through our practice.   ?You can also request your records electronically, in your MyChart account by selecting the ?Request Health Records? tab.  ?If you need additional information on how to request records, please go to [email or web address]/, choose Patient Information, then select Request Medical Records. ?To make an appointment or if you have any questions about your health care needs, please contact our office at [phone] and one of our staff members will be glad to assist you. ?[facility] is committed to providing exceptional care for you and our community. Thank you for allowing [place] to serve your health care needs. ?Sincerely, ? ?Windy Canny, Director [facility] ? also offers convenient  virtual care options. Sore throat? Sinus problems? Cold or flu symptoms? Get care from the comfort of home with [facility] Video Visits and e-Visits. Learn more about the non-emergency conditions treated and start your virtual visit at [email or web address] ? ?

## 2021-04-27 ENCOUNTER — Encounter: Payer: Self-pay | Admitting: Gastroenterology

## 2021-05-14 ENCOUNTER — Encounter: Payer: BC Managed Care – PPO | Admitting: Gastroenterology

## 2021-06-12 ENCOUNTER — Encounter: Payer: BC Managed Care – PPO | Admitting: Gastroenterology

## 2021-06-12 ENCOUNTER — Telehealth: Payer: Self-pay | Admitting: Gastroenterology

## 2021-06-12 NOTE — Telephone Encounter (Signed)
DEAR DR. CIRIGLIANO,   ? ?I tried to call this patient at 9:50 am today. ?I did not get anyone and the mailbox was full so I could not leave a message. ? ?I will NO SHOW Patient - [employer]  ?

## 2021-06-12 NOTE — Telephone Encounter (Signed)
Thank you for trying to reach her. Agree with no show.  ?

## 2022-06-30 ENCOUNTER — Other Ambulatory Visit: Payer: Self-pay

## 2022-06-30 ENCOUNTER — Encounter (HOSPITAL_BASED_OUTPATIENT_CLINIC_OR_DEPARTMENT_OTHER): Payer: Self-pay | Admitting: Emergency Medicine

## 2022-06-30 ENCOUNTER — Emergency Department (HOSPITAL_BASED_OUTPATIENT_CLINIC_OR_DEPARTMENT_OTHER)
Admission: EM | Admit: 2022-06-30 | Discharge: 2022-06-30 | Disposition: A | Discharge status: Home / Self Care | Payer: BC Managed Care – PPO | Attending: Emergency Medicine | Admitting: Emergency Medicine

## 2022-06-30 DIAGNOSIS — Z79899 Other long term (current) drug therapy: Secondary | ICD-10-CM | POA: Diagnosis not present

## 2022-06-30 DIAGNOSIS — I10 Essential (primary) hypertension: Secondary | ICD-10-CM

## 2022-06-30 DIAGNOSIS — R030 Elevated blood-pressure reading, without diagnosis of hypertension: Secondary | ICD-10-CM | POA: Diagnosis present

## 2022-06-30 LAB — CBC
HCT: 42.9 % (ref 36.0–46.0)
Hemoglobin: 13.7 g/dL (ref 12.0–15.0)
MCH: 26.1 pg (ref 26.0–34.0)
MCHC: 31.9 g/dL (ref 30.0–36.0)
MCV: 81.9 fL (ref 80.0–100.0)
Platelets: 281 10*3/uL (ref 150–400)
RBC: 5.24 MIL/uL — ABNORMAL HIGH (ref 3.87–5.11)
RDW: 13.2 % (ref 11.5–15.5)
WBC: 6.3 10*3/uL (ref 4.0–10.5)
nRBC: 0 % (ref 0.0–0.2)

## 2022-06-30 LAB — BASIC METABOLIC PANEL
Anion gap: 10 (ref 5–15)
BUN: 8 mg/dL (ref 6–20)
CO2: 27 mmol/L (ref 22–32)
Calcium: 9.3 mg/dL (ref 8.9–10.3)
Chloride: 102 mmol/L (ref 98–111)
Creatinine, Ser: 0.81 mg/dL (ref 0.44–1.00)
GFR, Estimated: >60 mL/min (ref >60)
Glucose, Bld: 119 mg/dL — ABNORMAL HIGH (ref 70–99)
Potassium: 4.1 mmol/L (ref 3.5–5.1)
Sodium: 139 mmol/L (ref 135–145)

## 2022-06-30 MED ORDER — HYDRALAZINE HCL 25 MG PO TABS
50.0000 mg | ORAL_TABLET | Freq: Once | ORAL | Status: AC
  Administered 2022-06-30: 50 mg via ORAL
  Filled 2022-06-30: qty 2

## 2022-06-30 NOTE — Discharge Instructions (Addendum)
As we discussed, your workup in the ER today was reassuring for acute findings.  Laboratory evaluation did not reveal any emergent concerns.  We have given you medication for your blood pressure which has improved your readings.  Given that you are asymptomatic, there is no indication for further workup.  I recommend that you do restart the metoprolol and hydrochlorothiazide that your primary care doctor prescribed for you.  Please take these medications as prescribed every day.  I also recommend that you check your blood pressure every day and journal your readings.  Call your primary care doctor tomorrow to schedule an appointment for follow-up and bring your readings to this appointment.  Return if development of any new or worsening symptoms.

## 2022-06-30 NOTE — ED Triage Notes (Signed)
New headache when woke up this morning, is being followed and has Rx for high  blood pressure but is trying holistic first. Took her BP today and was high so she took her Rx today.

## 2022-06-30 NOTE — ED Provider Notes (Signed)
[facility] Provider Note   CSN: [id number] Arrival date & time: 06/30/22  1939     History  Chief Complaint  Patient presents with   Hypertension    Joan Riley is a 52 y.o. female.  Patient with history of hypertension presents today with complaints of hypertension.  She states that she has been prescribed metoprolol and hydrochlorothiazide for her high blood pressure, however sometime ago she felt like she could manage her high blood pressure with diet, exercise, and holistic measures and therefore stopped these medications.  Initially she was having success with this and subsequently stopped checking her blood pressure.  She states in the last few weeks she has been under additional stress and this morning when she woke up she noted that she had a headache and she checked her blood pressure and noticed that it was in the 200s systolic.  She took her home metoprolol but was unable to get the readings down and therefore presented with concern for same.  She states that her headache resolved many hours ago and it has not returned.  She denies fevers, chills, vision changes, chest pain, shortness of breath, abdominal pain, nausea, vomiting, leg pain, or leg swelling.  The history is provided by the patient. No language interpreter was used.  Hypertension       Home Medications Prior to Admission medications   Medication Sig Start Date End Date Taking? Authorizing Provider  metoprolol succinate (TOPROL-XL) 50 MG 24 hr tablet  09/01/18   [provider]      Allergies    Lisinopril and Sulfa antibiotics    Review of Systems   Review of Systems  All other systems reviewed and are negative.   Physical Exam Updated Vital Signs BP (!) 167/84   Pulse 96   Temp 99.3 F (37.4 C) (Oral)   Resp 15   Ht 5\' 7"  (1.702 m)   Wt 99.8 kg   LMP 10/19/2011   SpO2 98%   BMI 34.46 kg/m  Physical Exam Vitals and nursing note  reviewed.  Constitutional:      General: She is not in acute distress.    Appearance: Normal appearance. She is normal weight. She is not ill-appearing, toxic-appearing or diaphoretic.  HENT:     Head: Normocephalic and atraumatic.  Eyes:     Extraocular Movements: Extraocular movements intact.     Pupils: Pupils are equal, round, and reactive to light.  Cardiovascular:     Rate and Rhythm: Normal rate and regular rhythm.     Heart sounds: Normal heart sounds.  Pulmonary:     Effort: Pulmonary effort is normal. No respiratory distress.     Breath sounds: Normal breath sounds.  Abdominal:     General: Abdomen is flat.     Palpations: Abdomen is soft.     Tenderness: There is no abdominal tenderness.  Musculoskeletal:        General: Normal range of motion.     Cervical back: Normal range of motion.     Right lower leg: No edema.     Left lower leg: No edema.  Skin:    General: Skin is warm and dry.  Neurological:     General: No focal deficit present.     Mental Status: She is alert and oriented to person, place, and time.  Psychiatric:        Mood and Affect: Mood normal.  Behavior: Behavior normal.     ED Results / Procedures / Treatments   Labs (all labs ordered are listed, but only abnormal results are displayed) Labs Reviewed  CBC - Abnormal; Notable for the following components:      Result Value   RBC 5.24 (*)    All other components within normal limits  BASIC METABOLIC PANEL - Abnormal; Notable for the following components:   Glucose, Bld 119 (*)    All other components within normal limits    EKG EKG Interpretation  Date/Time:  Wednesday Jun 30 2022 20:12:47 EDT Ventricular Rate:  108 PR Interval:  121 QRS Duration: 83 QT Interval:  329 QTC Calculation: 441 R Axis:   170 Text Interpretation: Right and left arm electrode reversal, interpretation assumes no reversal Sinus tachycardia Right axis deviation Low voltage, precordial leads RSR' in V1  or V2, probably normal variant Abnormal T, consider ischemia, lateral leads Minimal ST elevation, lateral leads no prior ECG for comparison. No STEMI Confirmed by Theda Belfast ([id number]) on 06/30/2022 9:16:36 PM  Radiology No results found.  Procedures Procedures    Medications Ordered in ED Medications  hydrALAZINE (APRESOLINE) tablet 50 mg (50 mg Oral Given 06/30/22 2157)    ED Course/ Medical Decision Making/ A&P                             Medical Decision Making Amount and/or Complexity of Data Reviewed Labs: ordered.  Risk Prescription drug management.   This patient is a 52 y.o. female who presents to the ED for concern of hypertension.   Differential diagnoses prior to evaluation: Hypertension, hypertensive urgency, hypertensive emergency  Past Medical History / Social History / Additional history: Chart reviewed. Pertinent results include: hx hypertension prescribed HCTZ and metoprolol  Physical Exam: Physical exam performed. The pertinent findings include: per above, no pertinent physical exam findings. Patient is asymtpomatic  Labs: Labs resulted and reveal no acute laboratory findings.  Kidney function WNL  Medications / Treatment: Hydralazine for hypertension. BP initially 203/108, improved to 167/84.    Disposition: After consideration of the diagnostic results and the patients response to treatment, I feel that emergency department workup does not suggest an emergent condition requiring admission or immediate intervention beyond what has been performed at this time. The plan is: discharge with close outpatient follow-up.  Discussed with patient, she has plenty of refills for her metoprolol and hydrochlorothiazide.  Recommend that she restart both of these medications and check her blood pressures regularly.  I have also recommended that she journal her findings and bring them to her next primary care appointment. Evaluation and diagnostic testing in the  emergency department does not suggest an emergent condition requiring admission or immediate intervention beyond what has been performed at this time.  Plan for discharge with close PCP follow-up.  Patient is understanding and amenable with plan, educated on red flag symptoms that would prompt immediate return.  Patient discharged in stable condition.   Final Clinical Impression(s) / ED Diagnoses Final diagnoses:  Elevated blood pressure reading with diagnosis of hypertension    Rx / DC Orders ED Discharge Orders     None     An After Visit Summary was printed and given to the patient.     Vear Clock 06/30/22 2321    Tegeler, Canary Brim, MD 06/30/22 [phone]

## 2023-01-28 IMAGING — CR DG NECK SOFT TISSUE
2 series · 2 of 2 positions shown · non-contrast
Comparison: None.

CLINICAL DATA: Odynophagia

EXAM:
NECK SOFT TISSUES - 1+ VIEW

[w soft tissue neck]
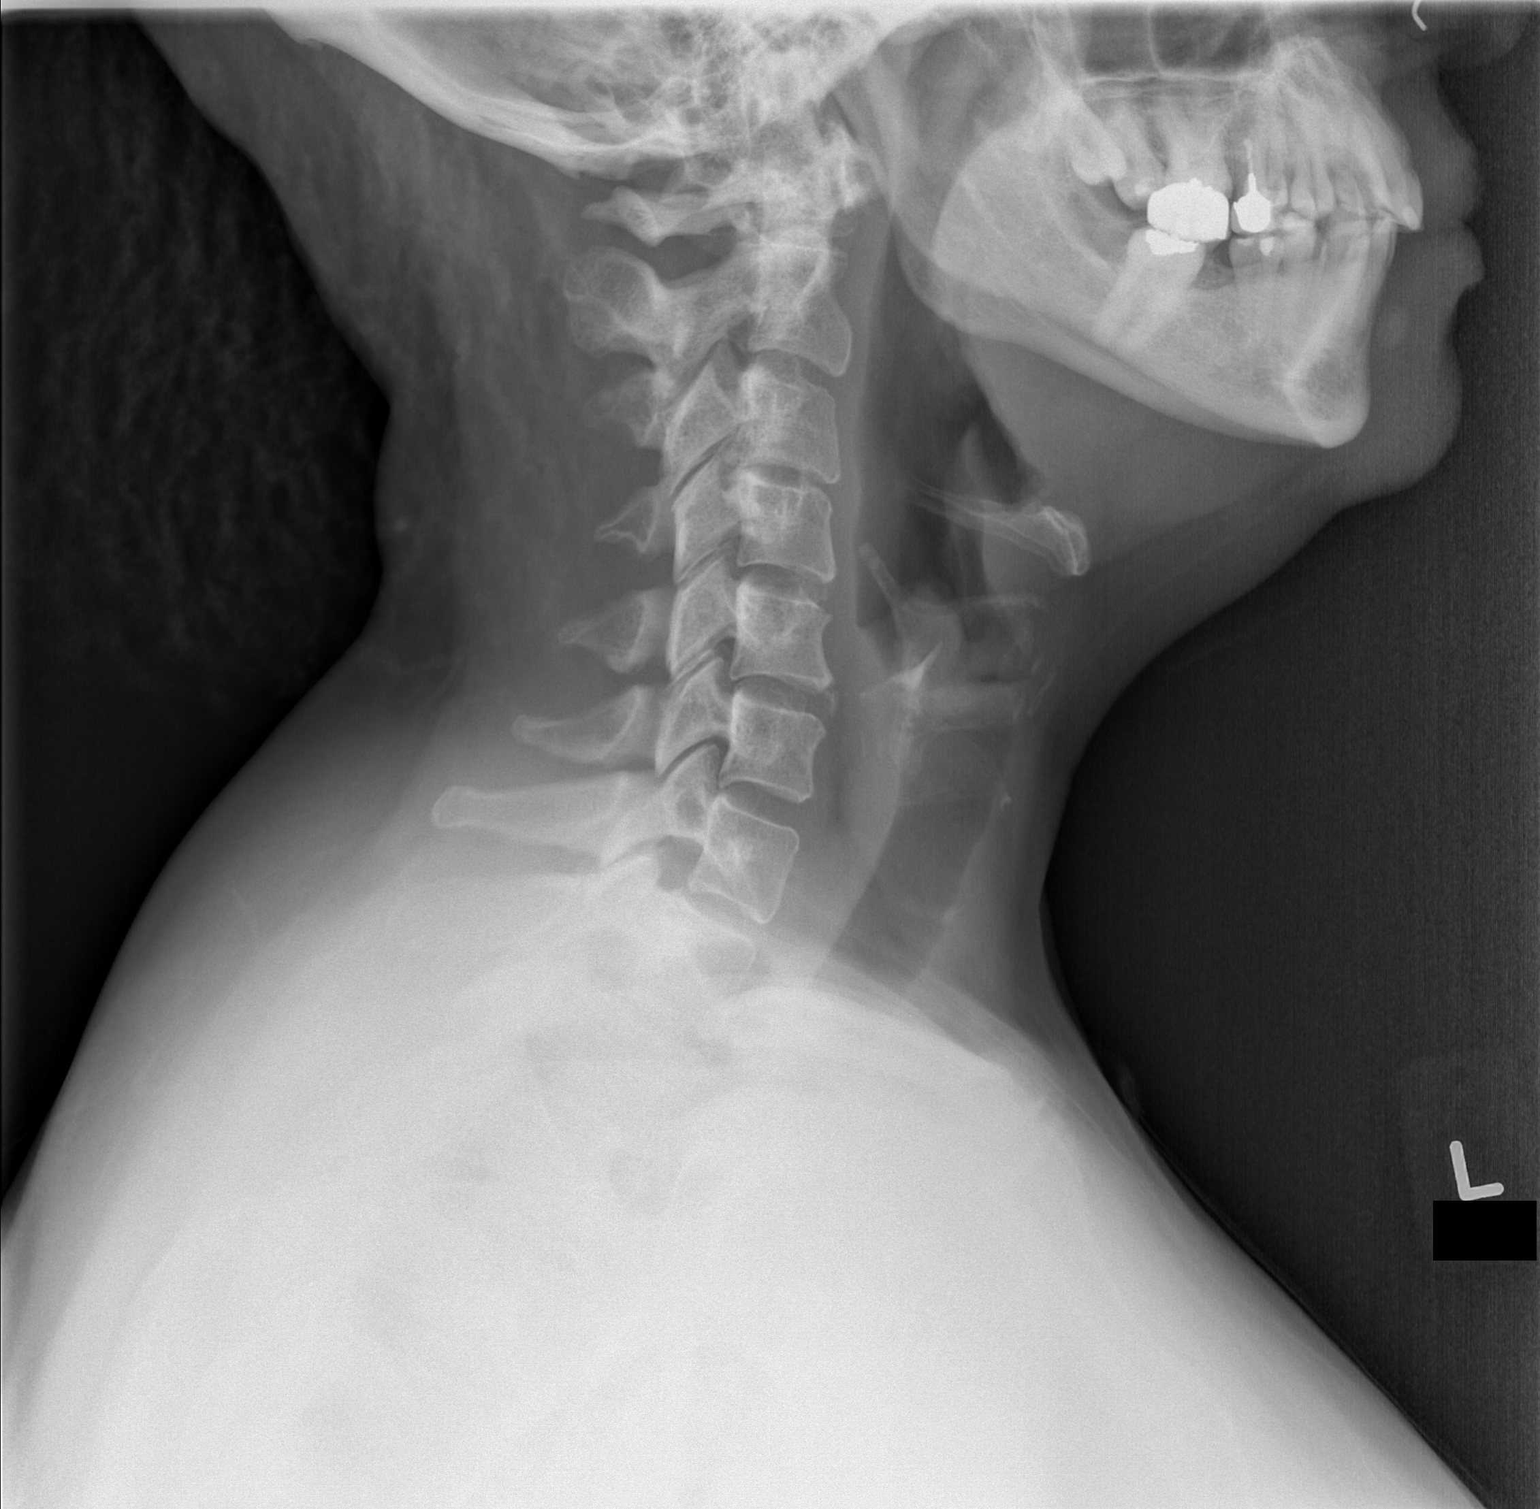

[w soft tissue neck ap]
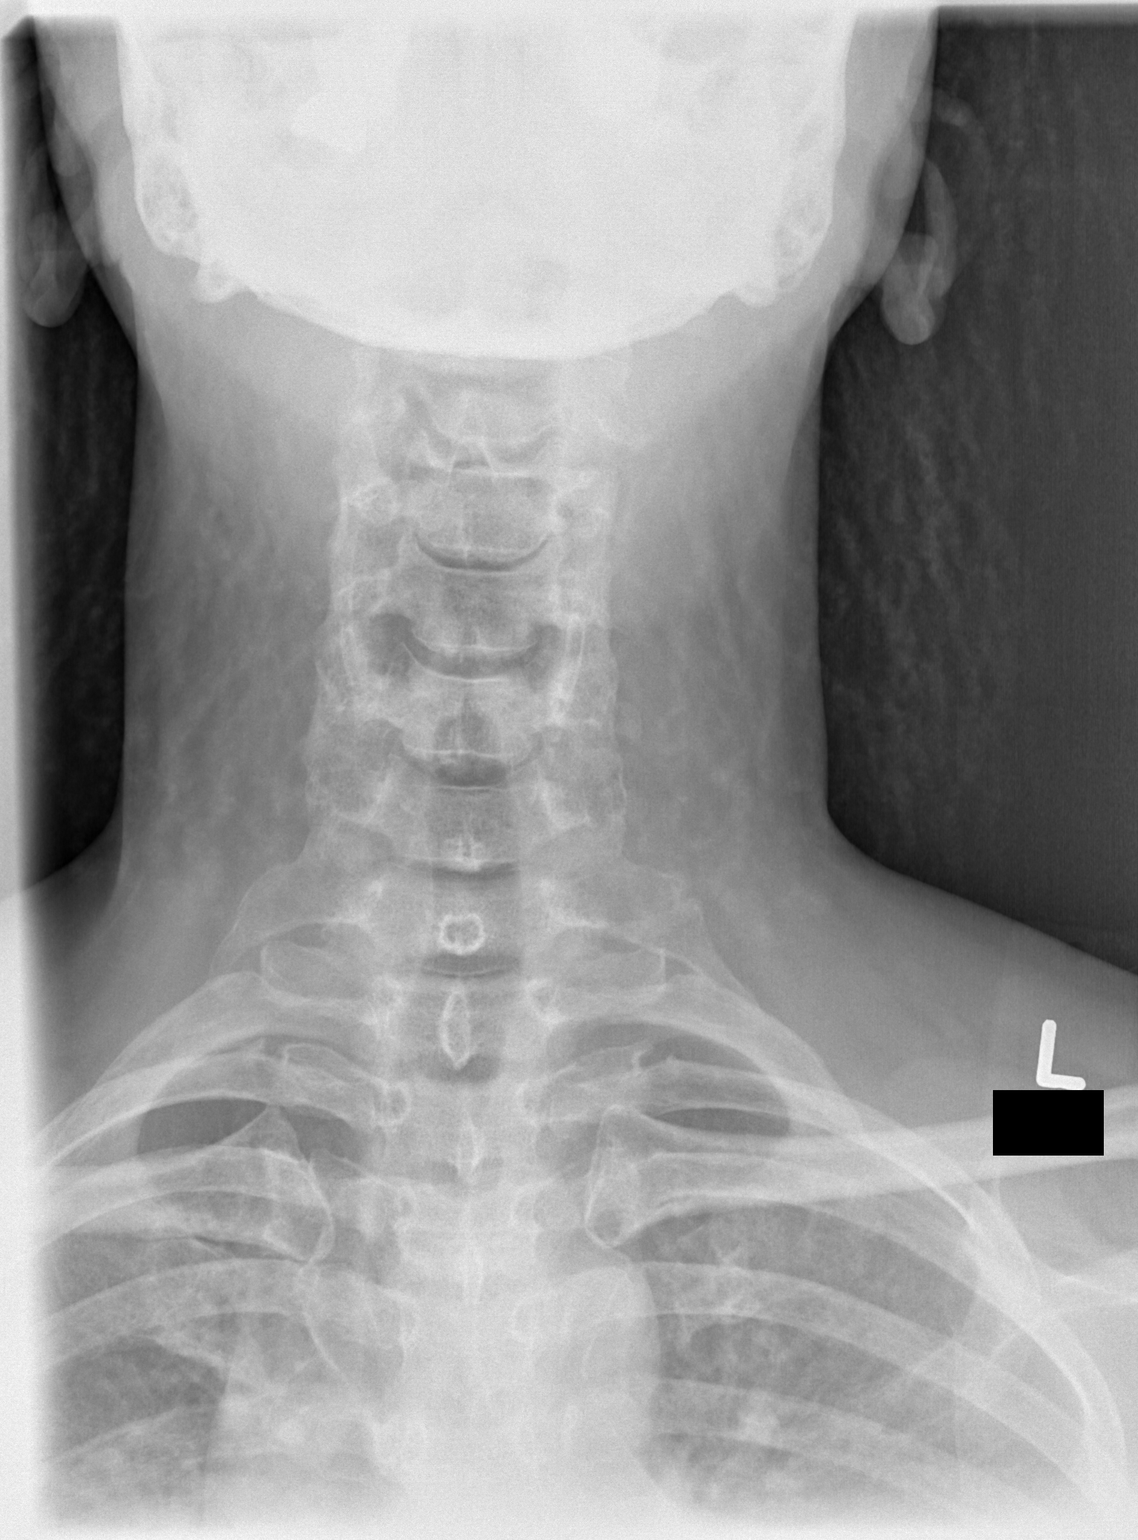

[2 of 2 positions shown; findings below may reference images not displayed]

FINDINGS: There is no evidence of retropharyngeal soft tissue swelling or
epiglottic enlargement. The cervical airway is unremarkable and no
radio-opaque foreign body identified.
IMPRESSION: Negative.
